# Patient Record
Sex: Male | Born: 1965
Health system: Southern US, Community
[De-identification: ages and names within clinical notes are randomized; demographics above are authoritative.]

## PROBLEM LIST (undated history)

## (undated) DIAGNOSIS — Z8619 Personal history of other infectious and parasitic diseases: Secondary | ICD-10-CM

## (undated) DIAGNOSIS — M353 Polymyalgia rheumatica: Secondary | ICD-10-CM

## (undated) DIAGNOSIS — I10 Essential (primary) hypertension: Secondary | ICD-10-CM

## (undated) HISTORY — DX: Polymyalgia rheumatica: M35.3

## (undated) HISTORY — DX: Personal history of other infectious and parasitic diseases: Z86.19

## (undated) HISTORY — PX: HAND SURGERY: SHX662

## (undated) HISTORY — PX: WISDOM TOOTH EXTRACTION: SHX21

## (undated) HISTORY — DX: Essential (primary) hypertension: I10

## (undated) HISTORY — PX: OTHER SURGICAL HISTORY: SHX169

---

## 2006-10-17 ENCOUNTER — Ambulatory Visit: Payer: Self-pay | Admitting: Internal Medicine

## 2006-10-17 LAB — CONVERTED CEMR LAB
ALT: 32 units/L (ref 0–40)
AST: 26 units/L (ref 0–37)
Albumin: 3.7 g/dL (ref 3.5–5.2)
Alkaline Phosphatase: 53 units/L (ref 39–117)
BUN: 13 mg/dL (ref 6–23)
Basophils Absolute: 0.1 10*3/uL (ref 0.0–0.1)
Basophils Relative: 0.9 % (ref 0.0–1.0)
Bilirubin, Direct: 0.1 mg/dL (ref 0.0–0.3)
CO2: 32 meq/L (ref 19–32)
Calcium: 9.4 mg/dL (ref 8.4–10.5)
Chloride: 108 meq/L (ref 96–112)
Cholesterol: 136 mg/dL (ref 0–200)
Creatinine, Ser: 0.9 mg/dL (ref 0.4–1.5)
Eosinophils Absolute: 0.1 10*3/uL (ref 0.0–0.6)
Eosinophils Relative: 1.6 % (ref 0.0–5.0)
GFR calc Af Amer: 120 mL/min
GFR calc non Af Amer: 99 mL/min
Glucose, Bld: 114 mg/dL — ABNORMAL HIGH (ref 70–99)
HCT: 38.2 % — ABNORMAL LOW (ref 39.0–52.0)
HDL: 40 mg/dL (ref >39.0)
Hemoglobin: 13.1 g/dL (ref 13.0–17.0)
Hgb A1c MFr Bld: 5.7 % (ref 4.6–6.0)
LDL Cholesterol: 74 mg/dL (ref 0–99)
Lymphocytes Relative: 24.1 % (ref 12.0–46.0)
MCHC: 34.2 g/dL (ref 30.0–36.0)
MCV: 88.5 fL (ref 78.0–100.0)
Monocytes Absolute: 1.1 10*3/uL — ABNORMAL HIGH (ref 0.2–0.7)
Monocytes Relative: 17.6 % — ABNORMAL HIGH (ref 3.0–11.0)
Neutro Abs: 3.3 10*3/uL (ref 1.4–7.7)
Neutrophils Relative %: 55.8 % (ref 43.0–77.0)
Platelets: 179 10*3/uL (ref 150–400)
Potassium: 4.1 meq/L (ref 3.5–5.1)
RBC: 4.32 M/uL (ref 4.22–5.81)
RDW: 12.7 % (ref 11.5–14.6)
Sodium: 148 meq/L — ABNORMAL HIGH (ref 135–145)
TSH: 2.57 microintl units/mL (ref 0.35–5.50)
Total Bilirubin: 0.5 mg/dL (ref 0.3–1.2)
Total CHOL/HDL Ratio: 3.4
Total Protein: 6.8 g/dL (ref 6.0–8.3)
Triglycerides: 112 mg/dL (ref 0–149)
VLDL: 22 mg/dL (ref 0–40)
WBC: 6 10*3/uL (ref 4.5–10.5)

## 2006-10-19 ENCOUNTER — Ambulatory Visit: Payer: Self-pay | Admitting: Internal Medicine

## 2006-10-24 ENCOUNTER — Ambulatory Visit: Payer: Self-pay | Admitting: Internal Medicine

## 2007-09-25 ENCOUNTER — Encounter: Payer: Self-pay | Admitting: Internal Medicine

## 2009-02-25 ENCOUNTER — Ambulatory Visit: Payer: Self-pay | Admitting: Internal Medicine

## 2009-02-27 LAB — CONVERTED CEMR LAB
Basophils Absolute: 0 10*3/uL (ref 0.0–0.1)
HCT: 39.8 % (ref 39.0–52.0)
Lymphocytes Relative: 30.6 % (ref 12.0–46.0)
Lymphs Abs: 1.7 10*3/uL (ref 0.7–4.0)
MCV: 90.8 fL (ref 78.0–100.0)
Monocytes Absolute: 0.7 10*3/uL (ref 0.1–1.0)
Platelets: 172 10*3/uL (ref 150.0–400.0)
Prothrombin Time: 10.9 s (ref 10.9–13.3)
RBC: 4.38 M/uL (ref 4.22–5.81)
RDW: 11.9 % (ref 11.5–14.6)

## 2009-10-27 ENCOUNTER — Ambulatory Visit: Payer: Self-pay | Admitting: Internal Medicine

## 2009-10-27 LAB — CONVERTED CEMR LAB
Albumin: 4.1 g/dL (ref 3.5–5.2)
Basophils Absolute: 0 10*3/uL (ref 0.0–0.1)
Chloride: 106 meq/L (ref 96–112)
Creatinine, Ser: 0.9 mg/dL (ref 0.4–1.5)
Eosinophils Absolute: 0.1 10*3/uL (ref 0.0–0.7)
Eosinophils Relative: 2 % (ref 0.0–5.0)
GFR calc non Af Amer: 97.3 mL/min (ref >60)
LDL Cholesterol: 89 mg/dL (ref 0–99)
Leukocytes, UA: NEGATIVE
Lymphocytes Relative: 38.3 % (ref 12.0–46.0)
Lymphs Abs: 2 10*3/uL (ref 0.7–4.0)
MCV: 93.8 fL (ref 78.0–100.0)
Monocytes Relative: 11 % (ref 3.0–12.0)
Neutro Abs: 2.4 10*3/uL (ref 1.4–7.7)
Neutrophils Relative %: 48 % (ref 43.0–77.0)
Nitrite: NEGATIVE
PSA: 0.62 ng/mL (ref 0.10–4.00)
Potassium: 4.2 meq/L (ref 3.5–5.1)
RBC: 4.31 M/uL (ref 4.22–5.81)
Specific Gravity, Urine: >=1.03 (ref 1.000–1.030)
TSH: 2.39 microintl units/mL (ref 0.35–5.50)
Total Bilirubin: 0.5 mg/dL (ref 0.3–1.2)
Total CHOL/HDL Ratio: 3
Triglycerides: 74 mg/dL (ref 0.0–149.0)
Urobilinogen, UA: 0.2 (ref 0.0–1.0)
WBC: 5.1 10*3/uL (ref 4.5–10.5)

## 2009-11-03 ENCOUNTER — Ambulatory Visit: Payer: Self-pay | Admitting: Internal Medicine

## 2009-11-04 LAB — CONVERTED CEMR LAB: CRP, High Sensitivity: 0.1 (ref 0.00–5.00)

## 2010-09-29 NOTE — Assessment & Plan Note (Signed)
Summary: cpx/cjr   Vital Signs:  Patient profile:   45 year old male Height:      71 inches Weight:      180.5 pounds BMI:     25.27 Pulse rate:   68 / minute Pulse rhythm:   regular Resp:     12 per minute BP sitting:   112 / 76  (left arm)  Vitals Entered By: Gladis Riffle, RN (November 03, 2009 8:15 AM) CC: cpx, labs done Is Patient Diabetic? No   CC:  cpx and labs done.  History of Present Illness: CPX  Preventive Screening-Counseling & Management  Alcohol-Tobacco     Smoking Status: never  Current Medications (verified): 1)  None  Allergies (verified): No Known Drug Allergies  Family History: mother--healthy father--CABG at age 32 yo (poor health habits), Throat CA--deceased age 13  Social History: Married Never Smoked Alcohol use-yes--minimal exercise 3 times weekly Occupation: Human resources officer (traveling a lot)   Impression & Recommendations:  Problem # 1:  PREVENTIVE HEALTH CARE (ICD-V70.0) health maint UTD  Other Orders: Venipuncture (16109) TLB-CRP-High Sensitivity (C-Reactive Protein) (86140-FCRP)   Immunization History:  Tetanus/Td Immunization History:    Tetanus/Td:  historical (08/30/2002)  Physical Exam General Appearance: well developed, well nourished, no acute distress Eyes: conjunctiva and lids normal, PERRL, EOMI,  Ears, Nose, Mouth, Throat: TM clear, nares clear, oral exam WNL Neck: supple, no lymphadenopathy, no thyromegaly, no JVD Respiratory: clear to auscultation and percussion, respiratory effort normal Cardiovascular: regular rate and rhythm, S1-S2, no murmur, rub or gallop, no bruits, peripheral pulses normal and symmetric, no cyanosis, clubbing, edema or varicosities Chest: no scars, masses, tenderness; no asymmetry, skin changes, nipple discharge, no gynecomastia   Gastrointestinal: soft, non-tender; no hepatosplenomegaly, masses; active bowel sounds all quadrants, ; no masses, tenderness, hemorrhoids    Genitourinary: no hernia, testicular mass, or prostate enlargement Lymphatic: no cervical, axillary or inguinal adenopathy Musculoskeletal: gait normal, muscle tone and strength WNL, no joint swelling, effusions, discoloration, crepitus  Skin: clear, good turgor, color WNL, no rashes, lesions, or ulcerations Neurologic: normal mental status, normal reflexes, normal strength, sensation, and motion Psychiatric: alert; oriented to person, place and time Other Exam:      Immunization History:  Tetanus/Td Immunization History:    Tetanus/Td:  Historical (08/30/2002)

## 2012-05-30 ENCOUNTER — Other Ambulatory Visit (INDEPENDENT_AMBULATORY_CARE_PROVIDER_SITE_OTHER): Payer: BC Managed Care – PPO

## 2012-05-30 DIAGNOSIS — Z0000 Encounter for general adult medical examination without abnormal findings: Secondary | ICD-10-CM

## 2012-05-30 LAB — HEPATIC FUNCTION PANEL
AST: 20 U/L (ref 0–37)
Albumin: 4.2 g/dL (ref 3.5–5.2)
Alkaline Phosphatase: 43 U/L (ref 39–117)
Bilirubin, Direct: 0 mg/dL (ref 0.0–0.3)
Total Protein: 7.2 g/dL (ref 6.0–8.3)

## 2012-05-30 LAB — CBC WITH DIFFERENTIAL/PLATELET
Eosinophils Relative: 0.9 % (ref 0.0–5.0)
HCT: 42 % (ref 39.0–52.0)
Lymphs Abs: 2 10*3/uL (ref 0.7–4.0)
MCV: 93.2 fl (ref 78.0–100.0)
Monocytes Absolute: 0.5 10*3/uL (ref 0.1–1.0)
Platelets: 201 10*3/uL (ref 150.0–400.0)
WBC: 6.2 10*3/uL (ref 4.5–10.5)

## 2012-05-30 LAB — POCT URINALYSIS DIPSTICK
Ketones, UA: NEGATIVE
Leukocytes, UA: NEGATIVE
Protein, UA: NEGATIVE
Spec Grav, UA: 1.015
pH, UA: 7.5

## 2012-05-30 LAB — BASIC METABOLIC PANEL
CO2: 30 mEq/L (ref 19–32)
Calcium: 9.5 mg/dL (ref 8.4–10.5)
Creatinine, Ser: 1 mg/dL (ref 0.4–1.5)
GFR: 83.25 mL/min (ref >60.00)

## 2012-05-30 LAB — LIPID PANEL
Cholesterol: 205 mg/dL — ABNORMAL HIGH (ref 0–200)
Triglycerides: 128 mg/dL (ref 0.0–149.0)

## 2012-06-23 ENCOUNTER — Ambulatory Visit (INDEPENDENT_AMBULATORY_CARE_PROVIDER_SITE_OTHER): Payer: BC Managed Care – PPO | Admitting: Internal Medicine

## 2012-06-23 ENCOUNTER — Encounter: Payer: Self-pay | Admitting: Internal Medicine

## 2012-06-23 VITALS — BP 124/72 | HR 54 | Temp 98.0°F | Ht 71.25 in | Wt 184.0 lb

## 2012-06-23 DIAGNOSIS — Z0000 Encounter for general adult medical examination without abnormal findings: Secondary | ICD-10-CM

## 2012-06-23 DIAGNOSIS — E785 Hyperlipidemia, unspecified: Secondary | ICD-10-CM

## 2012-06-23 NOTE — Progress Notes (Signed)
Patient ID: Daniel Blair, male   DOB: [birth year], 46 y.o.   MRN: [id number] cpx  No past medical history on file.  History   Social History  . Marital Status: Married    Spouse Name: N/A    Number of Children: N/A  . Years of Education: N/A   Occupational History  . Not on file.   Social History Main Topics  . Smoking status: Never Smoker   . Smokeless tobacco: Not on file  . Alcohol Use: Yes  . Drug Use: No  . Sexually Active: Not on file   Other Topics Concern  . Not on file   Social History Narrative  . No narrative on file    No past surgical history on file.  Family History  Problem Relation Age of Onset  . Heart disease Father     CABG  . Cancer Father     throat    No Known Allergies  No current outpatient prescriptions on file prior to visit.     patient denies chest pain, shortness of breath, orthopnea. Denies lower extremity edema, abdominal pain, change in appetite, change in bowel movements. Patient denies rashes, musculoskeletal complaints. No other specific complaints in a complete review of systems.   BP 124/72  Pulse 54  Temp 98 F (36.7 C) (Oral)  Ht 5' 11.25" (1.81 m)  Wt 184 lb (83.462 kg)  BMI 25.48 kg/m2 Well-developed male in no acute distress. HEENT exam atraumatic, normocephalic, extraocular muscles are intact. Conjunctivae are pink without exudate. Neck is supple without lymphadenopathy, thyromegaly, jugular venous distention. Chest is clear to auscultation without increased work of breathing. Cardiac exam S1-S2 are regular. The PMI is normal. No significant murmurs or gallops. Abdominal exam active bowel sounds, soft, nontender. No abdominal bruits. Extremities no clubbing cyanosis or edema. Peripheral pulses are normal without bruits. Neurologic exam alert and oriented without any motor or sensory deficits.   A/P- well visit, health maint UTD

## 2012-09-18 ENCOUNTER — Other Ambulatory Visit (INDEPENDENT_AMBULATORY_CARE_PROVIDER_SITE_OTHER): Payer: BC Managed Care – PPO

## 2012-09-18 DIAGNOSIS — E785 Hyperlipidemia, unspecified: Secondary | ICD-10-CM

## 2012-09-18 LAB — LIPID PANEL
HDL: 48.9 mg/dL (ref >39.00)
LDL Cholesterol: 87 mg/dL (ref 0–99)
Total CHOL/HDL Ratio: 3

## 2012-09-25 ENCOUNTER — Ambulatory Visit: Payer: BC Managed Care – PPO | Admitting: Internal Medicine

## 2012-09-26 ENCOUNTER — Encounter: Payer: Self-pay | Admitting: Internal Medicine

## 2012-09-26 ENCOUNTER — Ambulatory Visit (INDEPENDENT_AMBULATORY_CARE_PROVIDER_SITE_OTHER): Payer: BC Managed Care – PPO | Admitting: Internal Medicine

## 2012-09-26 VITALS — BP 116/80 | HR 64 | Temp 98.3°F | Ht 71.0 in | Wt 178.0 lb

## 2012-09-26 DIAGNOSIS — E785 Hyperlipidemia, unspecified: Secondary | ICD-10-CM

## 2012-09-26 NOTE — Progress Notes (Signed)
Patient ID: Daniel Blair, male   DOB: 1966-08-07, 47 y.o.   MRN: [id number] F/u lipids- he feels well and has been exercising and following a low fat diet  Reviewed previous lipid panel  Exam-- reviewed vitals.  A/p_ hyperlipidemia- resolved  Lipid Panel     Component Value Date/Time   CHOL 150 09/18/2012 0831   TRIG 71.0 09/18/2012 0831   HDL 48.90 09/18/2012 0831   CHOLHDL 3 09/18/2012 0831   VLDL 14.2 09/18/2012 0831   LDLCALC 87 09/18/2012 0831    Continue excellent health habits

## 2013-02-06 ENCOUNTER — Ambulatory Visit (INDEPENDENT_AMBULATORY_CARE_PROVIDER_SITE_OTHER): Payer: Managed Care, Other (non HMO) | Admitting: Family Medicine

## 2013-02-06 ENCOUNTER — Encounter: Payer: Self-pay | Admitting: Family Medicine

## 2013-02-06 VITALS — BP 120/86 | Temp 98.6°F | Wt 183.0 lb

## 2013-02-06 DIAGNOSIS — H00016 Hordeolum externum left eye, unspecified eyelid: Secondary | ICD-10-CM

## 2013-02-06 DIAGNOSIS — H00019 Hordeolum externum unspecified eye, unspecified eyelid: Secondary | ICD-10-CM

## 2013-02-06 NOTE — Progress Notes (Signed)
Chief Complaint  Patient presents with  . infecton under eye    HPI:  Acute visit for bump below eye: -L lower eyelid -started yesterday -has done anything for -denies: fevers, pain, chills, decreased vision  ROS: See pertinent positives and negatives per HPI.  No past medical history on file.  Family History  Problem Relation Age of Onset  . Heart disease Father     CABG  . Cancer Father     throat    History   Social History  . Marital Status: Married    Spouse Name: N/A    Number of Children: N/A  . Years of Education: N/A   Social History Main Topics  . Smoking status: Never Smoker   . Smokeless tobacco: None  . Alcohol Use: Yes  . Drug Use: No  . Sexually Active: None   Other Topics Concern  . None   Social History Narrative  . None    No current outpatient prescriptions on file.  EXAM:  Filed Vitals:   02/06/13 1547  BP: 120/86  Temp: 98.6 F (37 C)    Body mass index is 25.53 kg/(m^2).  GENERAL: vitals reviewed and listed above, alert, oriented, appears well hydrated and in no acute distress  HEENT: atraumatic, conjunttiva clear - hordeolum L lower eyelid, no obvious abnormalities on inspection of external nose and ears, EOM and visual acuity grossly intact, no signs of cellulitis or pustule  NECK: no obvious masses on inspection  LUNGS: clear to auscultation bilaterally, no wheezes, rales or rhonchi, good air movement  CV: HRRR, no peripheral edema  MS: moves all extremities without noticeable abnormality  PSYCH: pleasant and cooperative, no obvious depression or anxiety  ASSESSMENT AND PLAN:  Discussed the following assessment and plan:  Hordeolum externum, left  -Patient advised to return or notify a doctor immediately if symptoms worsen or persist or new concerns arise.  Patient Instructions  Sty A sty (hordeolum) is a blockage of a gland in the eyelid located at the base of the eyelash. A sty may develop a white or yellow  head of pus. It can be puffy (swollen). Usually, the sty will burst and pus will come out on its own. They do not leave lumps in the eyelid once they drain. A sty is often confused with another form of cyst of the eyelid called a chalazion. Chalazions occur within the eyelid and not on the edge where the bases of the eyelashes are. They often are red, sore and then form firm lumps in the eyelid. CAUSES   Germs.  Lasting (chronic) eyelid inflammation. SYMPTOMS   Tenderness, redness and swelling along the edge of the eyelid at the base of the eyelashes.  Sometimes, there is a white or yellow head of pus. It may or may not drain. DIAGNOSIS  An ophthalmologist will be able to distinguish between a sty and a chalazion and treat the condition appropriately.  TREATMENT   Styes are typically treated with warm packs (compresses) until drainage occurs.  In rare cases, medicines that kill germs (antibiotics) may be prescribed. These antibiotics may be in the form of drops, cream or pills.  If a hard lump has formed, it is generally necessary to do a small incision and remove the hardened contents of the cyst in a minor surgical procedure done in the office.  In suspicious cases, your caregiver may send the contents of the cyst to the lab to be certain that it is not a rare, but  dangerous form of cancer of the glands of the eyelid. HOME CARE INSTRUCTIONS   Wash your hands often and dry them with a clean towel. Avoid touching your eyelid. This may spread the infection to other parts of the eye.  Apply heat to your eyelid for 10 to 20 minutes, several times a day, to ease pain and help to heal it faster.  Do not squeeze the sty. Allow it to drain on its own. Wash your eyelid carefully 3 to 4 times per day to remove any pus. SEEK IMMEDIATE MEDICAL CARE IF:   Your eye becomes painful or puffy (swollen).  Your vision changes.  Your sty does not drain by itself within 3 days.  Your sty comes back  within a short period of time, even with treatment.  You have redness (inflammation) around the eye.  You have a fever. Document Released: 05/26/2005 Document Revised: 11/08/2011 Document Reviewed: 01/28/2009 [facility] Patient Information 2014 [place], Lona Kettle, Dahlia Client R.

## 2013-02-06 NOTE — Patient Instructions (Addendum)
Sty A sty (hordeolum) is a blockage of a gland in the eyelid located at the base of the eyelash. A sty may develop a white or yellow head of pus. It can be puffy (swollen). Usually, the sty will burst and pus will come out on its own. They do not leave lumps in the eyelid once they drain. A sty is often confused with another form of cyst of the eyelid called a chalazion. Chalazions occur within the eyelid and not on the edge where the bases of the eyelashes are. They often are red, sore and then form firm lumps in the eyelid. CAUSES   Germs.  Lasting (chronic) eyelid inflammation. SYMPTOMS   Tenderness, redness and swelling along the edge of the eyelid at the base of the eyelashes.  Sometimes, there is a white or yellow head of pus. It may or may not drain. DIAGNOSIS  An ophthalmologist will be able to distinguish between a sty and a chalazion and treat the condition appropriately.  TREATMENT   Styes are typically treated with warm packs (compresses) until drainage occurs.  In rare cases, medicines that kill germs (antibiotics) may be prescribed. These antibiotics may be in the form of drops, cream or pills.  If a hard lump has formed, it is generally necessary to do a small incision and remove the hardened contents of the cyst in a minor surgical procedure done in the office.  In suspicious cases, your caregiver may send the contents of the cyst to the lab to be certain that it is not a rare, but dangerous form of cancer of the glands of the eyelid. HOME CARE INSTRUCTIONS   Wash your hands often and dry them with a clean towel. Avoid touching your eyelid. This may spread the infection to other parts of the eye.  Apply heat to your eyelid for 10 to 20 minutes, several times a day, to ease pain and help to heal it faster.  Do not squeeze the sty. Allow it to drain on its own. Wash your eyelid carefully 3 to 4 times per day to remove any pus. SEEK IMMEDIATE MEDICAL CARE IF:   Your eye  becomes painful or puffy (swollen).  Your vision changes.  Your sty does not drain by itself within 3 days.  Your sty comes back within a short period of time, even with treatment.  You have redness (inflammation) around the eye.  You have a fever. Document Released: 05/26/2005 Document Revised: 11/08/2011 Document Reviewed: 01/28/2009 [facility] Patient Information 2014 [place], [place].

## 2014-08-02 ENCOUNTER — Ambulatory Visit: Payer: Managed Care, Other (non HMO) | Admitting: Family Medicine

## 2015-01-02 ENCOUNTER — Encounter: Payer: Self-pay | Admitting: Family Medicine

## 2015-01-02 ENCOUNTER — Ambulatory Visit (INDEPENDENT_AMBULATORY_CARE_PROVIDER_SITE_OTHER): Payer: Managed Care, Other (non HMO) | Admitting: Family Medicine

## 2015-01-02 VITALS — BP 106/74 | HR 68 | Temp 97.9°F | Ht 71.0 in | Wt 193.0 lb

## 2015-01-02 DIAGNOSIS — R739 Hyperglycemia, unspecified: Secondary | ICD-10-CM

## 2015-01-02 DIAGNOSIS — Z23 Encounter for immunization: Secondary | ICD-10-CM

## 2015-01-02 DIAGNOSIS — Z0000 Encounter for general adult medical examination without abnormal findings: Secondary | ICD-10-CM | POA: Diagnosis not present

## 2015-01-02 NOTE — Progress Notes (Signed)
Daniel Reddish, MD Phone: [phone]  Subjective:  Patient presents today for their annual physical and to establish care former patient of Dr. Leanne Chang . Chief complaint-noted.   Patient doing very well. Exercising 4 days a week though [employer] which he loves. We discussed with muscle gain target weight 180.   ROS- full  review of systems was completed and negative except for: sneezes hard, frequency per family urinary-no nocturia. Pertinent negative no chest pain or shortness of breath.   The following were reviewed and entered/updated in epic: No past medical history on file. Healthy.   Past Surgical History  Procedure Laterality Date  . Hand surgery      2012-7 screws and a pin    Family History  Problem Relation Age of Onset  . Heart disease Father     CABG age 72, died 61. Smoker, far different lifestyle.   . Cancer Father     throat, smoker    Medications- reviewed and updated No current outpatient prescriptions on file.   No current facility-administered medications for this visit.    Allergies-reviewed and updated No Known Allergies  History   Social History  . Marital Status: Married    Spouse Name: N/A  . Number of Children: N/A  . Years of Education: N/A   Social History Main Topics  . Smoking status: Never Smoker   . Smokeless tobacco: Not on file  . Alcohol Use: 3.6 oz/week    6 Standard drinks or equivalent per week  . Drug Use: No  . Sexual Activity: Not on file   Other Topics Concern  . None   Social History Narrative   Married. 2 kids oldest 3 in 2016.       Self employeed.       Hobbies: exercise, play golf, read    ROS--See HPI   Objective: BP 106/74 mmHg  Pulse 68  Temp(Src) 97.9 F (36.6 C)  Ht 5\' 11"  (1.803 m)  Wt 193 lb (87.544 kg)  BMI 26.93 kg/m2 Gen: NAD, resting comfortably HEENT: Mucous membranes are moist. Oropharynx normal. TM obscured by cerumen Neck: no thyromegaly CV: RRR no murmurs rubs or  gallops Lungs: CTAB no crackles, wheeze, rhonchi Abdomen: soft/nontender/nondistended/normal bowel sounds. No rebound or guarding.  Ext: no edema Skin: warm, dry, no rash Neuro: grossly normal, moves all extremities, PERRLA Declined rectal and GU  Assessment/Plan:  49 y.o. male presenting for annual physical.  Health Maintenance counseling: 1. Anticipatory guidance: Patient counseled regarding regular dental exams, wearing seatbelts, wearing sunscreen. Sees dermatology- history biopsies but no cancer.  2. Risk factor reduction:  Advised patient of need for regular exercise and diet rich and fruits and vegetables to reduce risk of heart attack and stroke.  3. Immunizations/screenings/ancillary studies Health Maintenance Due  Topic Date Due  . TETANUS/TDAP  -today 08/30/2012   1 year follow up to discuss prostate and colon cancer screening  Future fasting labs  Orders Placed This Encounter  Procedures  . Tdap vaccine greater than or equal to 7yo IM  . CBC    [place]    Standing Status: Future     Number of Occurrences:      Standing Expiration Date: 01/02/2016  . Comprehensive metabolic panel    [place]    Standing Status: Future     Number of Occurrences:      Standing Expiration Date: 01/02/2016    Order Specific Question:  Has the patient fasted?    Answer:  No  .  Lipid panel    [place]    Standing Status: Future     Number of Occurrences:      Standing Expiration Date: 01/02/2016    Order Specific Question:  Has the patient fasted?    Answer:  No  . TSH    [place]    Standing Status: Future     Number of Occurrences:      Standing Expiration Date: 01/02/2016  . Hemoglobin A1c    [place]    Standing Status: Future     Number of Occurrences:      Standing Expiration Date: 01/02/2016

## 2015-01-02 NOTE — Patient Instructions (Addendum)
Return for labs. Schedule visit at front. Water only after midnight.   Tdap- tetanus shot before you leave  Considering your muscle gain, I think a weight goal of 180 by working on diet is reasonable. Great job with the exercise.

## 2015-01-03 ENCOUNTER — Other Ambulatory Visit (INDEPENDENT_AMBULATORY_CARE_PROVIDER_SITE_OTHER): Payer: Managed Care, Other (non HMO)

## 2015-01-03 DIAGNOSIS — R739 Hyperglycemia, unspecified: Secondary | ICD-10-CM

## 2015-01-03 DIAGNOSIS — Z0000 Encounter for general adult medical examination without abnormal findings: Secondary | ICD-10-CM

## 2015-01-03 LAB — LIPID PANEL
CHOL/HDL RATIO: 3
Cholesterol: 158 mg/dL (ref 0–200)
HDL: 49.1 mg/dL (ref >39.00)
LDL Cholesterol: 86 mg/dL (ref 0–99)
NONHDL: 108.9
Triglycerides: 117 mg/dL (ref 0.0–149.0)
VLDL: 23.4 mg/dL (ref 0.0–40.0)

## 2015-01-03 LAB — COMPREHENSIVE METABOLIC PANEL
ALT: 14 U/L (ref 0–53)
AST: 16 U/L (ref 0–37)
Albumin: 4 g/dL (ref 3.5–5.2)
Alkaline Phosphatase: 45 U/L (ref 39–117)
BILIRUBIN TOTAL: 0.5 mg/dL (ref 0.2–1.2)
BUN: 22 mg/dL (ref 6–23)
CO2: 29 mEq/L (ref 19–32)
CREATININE: 1.02 mg/dL (ref 0.40–1.50)
Calcium: 9.7 mg/dL (ref 8.4–10.5)
Chloride: 103 mEq/L (ref 96–112)
GFR: 82.34 mL/min (ref >60.00)
Glucose, Bld: 93 mg/dL (ref 70–99)
Potassium: 4.4 mEq/L (ref 3.5–5.1)
Sodium: 139 mEq/L (ref 135–145)
Total Protein: 7 g/dL (ref 6.0–8.3)

## 2015-01-03 LAB — CBC
HEMATOCRIT: 41.3 % (ref 39.0–52.0)
Hemoglobin: 13.8 g/dL (ref 13.0–17.0)
MCHC: 33.4 g/dL (ref 30.0–36.0)
MCV: 89.9 fl (ref 78.0–100.0)
Platelets: 217 10*3/uL (ref 150.0–400.0)
RBC: 4.59 Mil/uL (ref 4.22–5.81)
RDW: 13.1 % (ref 11.5–15.5)
WBC: 6.6 10*3/uL (ref 4.0–10.5)

## 2015-01-03 LAB — HEMOGLOBIN A1C: HEMOGLOBIN A1C: 5.8 % (ref 4.6–6.5)

## 2015-01-03 LAB — TSH: TSH: 2.65 u[IU]/mL (ref 0.35–4.50)

## 2015-02-10 ENCOUNTER — Encounter: Payer: Managed Care, Other (non HMO) | Admitting: Family Medicine

## 2015-04-15 ENCOUNTER — Ambulatory Visit (INDEPENDENT_AMBULATORY_CARE_PROVIDER_SITE_OTHER): Payer: Managed Care, Other (non HMO) | Admitting: Family Medicine

## 2015-04-15 ENCOUNTER — Encounter: Payer: Self-pay | Admitting: Family Medicine

## 2015-04-15 VITALS — BP 100/80 | HR 84 | Temp 98.0°F | Ht 71.0 in | Wt 182.5 lb

## 2015-04-15 DIAGNOSIS — J321 Chronic frontal sinusitis: Secondary | ICD-10-CM | POA: Diagnosis not present

## 2015-04-15 MED ORDER — FLUTICASONE PROPIONATE 50 MCG/ACT NA SUSP: 2.0000 | Freq: Every day | NASAL | Status: DC

## 2015-04-15 NOTE — Progress Notes (Signed)
Pre visit review using our clinic review tool, if applicable. No additional management support is needed unless otherwise documented below in the visit note. 

## 2015-04-15 NOTE — Progress Notes (Signed)
  HPI:  Acute visit for:  Sinusitis: -started 2-3 weeks ago -went to [facility] on a Sunday, [facility] a few weeks ago and did amoxicillin -then saw ENT (Dr. Crossly) and was told had bad sinus infection and was told to do clindamycin for 10 days  -reports is doing much better but still with some frontal pain and pain behind R eye that recurred after finishing the abx, has follow up with ENT next week on Tuesday -no hx of sinus issues, allergies or headaches  ROS: See pertinent positives and negatives per HPI.  No past medical history on file.  Past Surgical History  Procedure Laterality Date  . Hand surgery      20 12-7 screws and a pin    Family History  Problem Relation Age of Onset  . Heart disease Father     CABG age 81, died 32. Smoker, far different lifestyle.   . Cancer Father     throat, smoker    Social History   Social History  . Marital Status: Married    Spouse Name: N/A  . Number of Children: N/A  . Years of Education: N/A   Social History Main Topics  . Smoking status: Never Smoker   . Smokeless tobacco: None  . Alcohol Use: 3.6 oz/week    6 Standard drinks or equivalent per week  . Drug Use: No  . Sexual Activity: Not Asked   Other Topics Concern  . None   Social History Narrative   Married. 2 kids oldest 25 in 2016.       Self employeed.       Hobbies: exercise, play golf, read     Current outpatient prescriptions:  .  fluticasone (FLONASE) 50 MCG/ACT nasal spray, Place 2 sprays into both nostrils daily., Disp: 16 g, Rfl: 0  EXAM:  Filed Vitals:   04/15/15 1407  BP: 100/80  Pulse: 84  Temp: 98 F (36.7 C)    Body mass index is 25.46 kg/(m^2).  GENERAL: vitals reviewed and listed above, alert, oriented, appears well hydrated and in no acute distress  HEENT: atraumatic, conjunttiva clear, no obvious abnormalities on inspection of external nose and ears, normal appearance of ear canals and TMs, clear nasal congestion, narrow nasal passages on  the L, mild post oropharyngeal erythema with PND, no tonsillar edema or exudate, no sinus TTP  NECK: no obvious masses on inspection  LUNGS: clear to auscultation bilaterally, no wheezes, rales or rhonchi, good air movement  CV: HRRR, no peripheral edema  MS: moves all extremities without noticeable abnormality  PSYCH: pleasant and cooperative, no obvious depression or anxiety  ASSESSMENT AND PLAN:  Discussed the following assessment and plan:  Frontal sinusitis, unspecified chronicity - Plan: fluticasone (FLONASE) 50 MCG/ACT nasal spray, CT Maxillofacial WO CM  -CT sinuses to confirm dx, flonase, follow up with ENT as planned -if not sinusitis needs follow up with PCP to consider other dx if pain persists and discussed will possibly need further imaging at that point -Patient advised to return or notify a doctor immediately if symptoms worsen or persist or new concerns arise.  There are no Patient Instructions on file for this visit.   Colin Benton R.

## 2015-04-16 ENCOUNTER — Inpatient Hospital Stay: Admission: RE | Admit: 2015-04-16 | Payer: Managed Care, Other (non HMO) | Source: Ambulatory Visit

## 2015-04-17 ENCOUNTER — Inpatient Hospital Stay: Admission: RE | Admit: 2015-04-17 | Payer: Managed Care, Other (non HMO) | Source: Ambulatory Visit

## 2015-04-17 ENCOUNTER — Telehealth: Payer: Self-pay | Admitting: Family Medicine

## 2015-04-17 DIAGNOSIS — R519 Headache, unspecified: Secondary | ICD-10-CM

## 2015-04-17 DIAGNOSIS — R51 Headache: Secondary | ICD-10-CM

## 2015-04-17 DIAGNOSIS — J321 Chronic frontal sinusitis: Secondary | ICD-10-CM

## 2015-04-17 NOTE — Telephone Encounter (Signed)
Pt states he texted Dr Ernesto Rutherford (he is on vacation in [place]) Pt states Dr Ernesto Rutherford prefers pt get a complete brain MRI.  Pt would like to know if you will be willing to put that order in  (MRI)so dr will have when he returns. Pt does not need the cat scan and did not go.

## 2015-04-18 ENCOUNTER — Ambulatory Visit (HOSPITAL_COMMUNITY)
Admission: RE | Admit: 2015-04-18 | Discharge: 2015-04-18 | Disposition: A | Discharge status: Home / Self Care | Payer: Managed Care, Other (non HMO) | Source: Ambulatory Visit | Attending: Family Medicine | Admitting: Family Medicine

## 2015-04-18 ENCOUNTER — Telehealth: Payer: Self-pay | Admitting: Family Medicine

## 2015-04-18 DIAGNOSIS — R519 Headache, unspecified: Secondary | ICD-10-CM

## 2015-04-18 DIAGNOSIS — M2740 Unspecified cyst of jaw: Secondary | ICD-10-CM | POA: Diagnosis not present

## 2015-04-18 DIAGNOSIS — J321 Chronic frontal sinusitis: Secondary | ICD-10-CM

## 2015-04-18 DIAGNOSIS — J329 Chronic sinusitis, unspecified: Secondary | ICD-10-CM | POA: Diagnosis present

## 2015-04-18 DIAGNOSIS — R51 Headache: Secondary | ICD-10-CM

## 2015-04-18 MED ORDER — GADOBENATE DIMEGLUMINE 529 MG/ML IV SOLN
18.0000 mL | Freq: Once | INTRAVENOUS | Status: AC | PRN
  Administered 2015-04-18: 18 mL via INTRAVENOUS

## 2015-04-18 NOTE — Telephone Encounter (Signed)
I called the patient he states he still wants to go forward with the MRI , and wanted me to cancel his CT threw his insurance  , doesn't want to wait for his ENT to do this . I will cancel his CT and proceed to get the MRI  Approve threw his insurance when the order is placed

## 2015-04-18 NOTE — Telephone Encounter (Signed)
Ok Please let pt know it may be difficult to approve this with his insurance, we can try. Alternatively he can wait until ENT get back to order if that is what they prefer as may have better chance for approval per my referral coodinator. Took 3 days to get CT approved.

## 2015-04-18 NOTE — Telephone Encounter (Signed)
pls hold of on faxing order. Continue w/ our referral

## 2015-04-18 NOTE — Telephone Encounter (Signed)
Pt states that [facility] can do his brain mri w/contrast on [place]. Pt thought it was today, But they states they have no appts until Monday.  Pls fax the order to them at [phone]

## 2015-04-21 ENCOUNTER — Inpatient Hospital Stay: Admission: RE | Admit: 2015-04-21 | Payer: Managed Care, Other (non HMO) | Source: Ambulatory Visit

## 2015-04-22 ENCOUNTER — Encounter: Payer: Self-pay | Admitting: Neurology

## 2015-04-22 ENCOUNTER — Ambulatory Visit (INDEPENDENT_AMBULATORY_CARE_PROVIDER_SITE_OTHER): Payer: Managed Care, Other (non HMO) | Admitting: Neurology

## 2015-04-22 VITALS — BP 154/110 | HR 65 | Ht 71.0 in | Wt 179.5 lb

## 2015-04-22 DIAGNOSIS — M316 Other giant cell arteritis: Secondary | ICD-10-CM | POA: Diagnosis not present

## 2015-04-22 DIAGNOSIS — B0230 Zoster ocular disease, unspecified: Secondary | ICD-10-CM | POA: Diagnosis not present

## 2015-04-22 DIAGNOSIS — G08 Intracranial and intraspinal phlebitis and thrombophlebitis: Secondary | ICD-10-CM

## 2015-04-22 MED ORDER — PREDNISONE 20 MG PO TABS: 60.0000 mg | ORAL_TABLET | Freq: Every day | ORAL | Status: DC

## 2015-04-22 NOTE — Patient Instructions (Signed)
Overall you are doing fairly well but I do want to suggest a few things today:   Remember to drink plenty of fluid, eat healthy meals and do not skip any meals. Try to eat protein with a every meal and eat a healthy snack such as fruit or nuts in between meals. Try to keep a regular sleep-wake schedule and try to exercise daily, particularly in the form of walking, 20-30 minutes a day, if you can.   As far as your medications are concerned, I would like to suggest: Prednisone 60mg  daily  As far as diagnostic testing: [facility] brain, [facility]  I would like to see you back in 2 weeks, sooner if we need to. Please call us with any interim questions, concerns, problems, updates or refill requests.   Please also call us for any test results so we can go over those with you on the phone.  My clinical assistant and will answer any of your questions and relay your messages to me and also relay most of my messages to you.   Our phone number is [phone]. We also have an after hours call service for urgent matters and there is a physician on-call for urgent questions. For any emergencies you know to call 911 or go to the nearest emergency room

## 2015-04-22 NOTE — Progress Notes (Signed)
[facility]    Provider:  Dr Jaynee Eagles Referring Provider: Marin Olp, MD Primary Care Physician:  Garret Reddish, MD  CC:  New onset headache  HPI:  Daniel Blair is a 49 y.o. male here as a referral from Dr. Yong Channel for headache.   Started a month ago today. He woke up with mucus, congestion and his head felt like a water bottle. He had a cold. He was prescribed amoxicillin. He felt better on the amoxicillin. It was the worse infection Dr. Ernesto Rutherford ever saw. Gave him a very strong antibiotic. 5 days later he felt great. This was 2 weeks ago. The prescription ended on a Saturday and he left town on the 14th. By Monday his head hurt again, headache came back. He developed a rash on the right side of his face and Dr. Ernesto Rutherford prescribed Valtrex for possible shingles. Started on Saturday. His head is still throbbing. The headaches are throbbing, not piercing, on the top of the head on the right down the face and forehead, behind the eye. And sometimes during the day it is by the ear and if he swallows it hurts. He wakes up at 3-4am in themorning and his head is killing him. Continuous all day long, without alleve it is a 9/10. No ligth sensitivity. No hearing changes. No stiffness in the neck. Sometimes turning to the left hurts. Slight nausea. No vomiting. Worse laying down. Better sitting up. He has a tender rash in the upper right of the forehead. Never had headaches before. He is rarely sick. No vision changes. The right side of his jaw hurts however no problems with chewing or jaw claudication. He does endorse tenderness in the right temple area on palpation. The whole right side of the head is hurting him. He can't lie on that side of the face. No vision changes.   Reviewed notes, labs and imaging from outside physicians, which showed:  MRI of the brain (personally reviewed images):  Normal appearance of the brain itself. No intraorbital or orbital apex abnormality  seen. Cavernous sinuses appear normal.  Mild mucosal thickening of the paranasal sinuses most notable in the right maxillary and frontal sinuses. Few retention cysts of the maxillary sinus floors.  Review of Systems: Patient complains of symptoms per HPI as well as the following symptoms: eye pain, headache, rash, restless legs. Pertinent negatives per HPI. All others negative.   Social History   Social History  . Marital Status: Married    Spouse Name: Erasmo Downer   . Number of Children: 2  . Years of Education: 12+   Occupational History  . Not on file.   Social History Main Topics  . Smoking status: Never Smoker   . Smokeless tobacco: Not on file  . Alcohol Use: 3.6 oz/week    6 Standard drinks or equivalent per week     Comment: 2 drinks per week  . Drug Use: No  . Sexual Activity: Not on file   Other Topics Concern  . Not on file   Social History Narrative   Married. 2 kids oldest 41 in 2016.    Self employeed.    Caffeine use: 1 cup in the morning , 2 sodas per week   Hobbies: exercise, play golf, read    Family History  Problem Relation Age of Onset  . Heart disease Father     CABG age 101, died 82. Smoker, far different lifestyle.   . Cancer Father     throat,  smoker    History reviewed. No pertinent past medical history.  Past Surgical History  Procedure Laterality Date  . Hand surgery Right     2012-7 screws and a pin    Current Outpatient Prescriptions  Medication Sig Dispense Refill  . doxycycline (VIBRA-TABS) 100 MG tablet Take 100 mg by mouth 2 (two) times daily.  0  . Lactobacillus (LACTINEX PO) Take 1 tablet by mouth daily.    . naproxen sodium (ANAPROX) 220 MG tablet Take 220 mg by mouth 2 (two) times daily with a meal. Takes 6 pills a day (aleve) for pain    . valACYclovir (VALTREX) 500 MG tablet Take 500 mg by mouth 3 (three) times daily.  0  . fluticasone (FLONASE) 50 MCG/ACT nasal spray Place 2 sprays into both nostrils daily. (Patient  not taking: Reported on 04/22/2015) 16 g 0   No current facility-administered medications for this visit.    Allergies as of 04/22/2015  . (No Known Allergies)    Vitals: BP 154/110 mmHg  Pulse 65  Ht _0  (1.803 m)  Wt 179 lb 8 oz (81.421 kg)  BMI 25.05 kg/m2 Last Weight:  Wt Readings from Last 1 Encounters:  04/22/15 179 lb 8 oz (81.421 kg)   Last Height:   Ht Readings from Last 1 Encounters:  04/22/15 _1  (1.803 m)   Physical exam: Exam: Gen: pain on the side of the head, conversant, well groomed                     CV: RRR, no MRG. No Carotid Bruits. No peripheral edema, warm, nontender Eyes: Right conjunctivae injected with lid ptosis, right hypesthesia in the forehead and top of the head, there is a vesicular eruption along the trigrminal V1 dermatome.   Neuro: Detailed Neurologic Exam  Speech:    Speech is normal; fluent and spontaneous with normal comprehension.  Cognition:    The patient is oriented to person, place, and time;     recent and remote memory intact;     language fluent;     normal attention, concentration,     fund of knowledge Cranial Nerves:    The pupils are equal, round, and reactive to light. The fundi are normal and spontaneous venous pulsations are present. Visual fields are full to finger confrontation. Extraocular movements are intact. Trigeminal sensation is intact and the muscles of mastication are normal. The face is symmetric. The palate elevates in the midline. Hearing intact. Voice is normal. Shoulder shrug is normal. The tongue has normal motion without fasciculations.   Coordination:    Normal finger to nose and heel to shin. Normal rapid alternating movements.   Gait:    Heel-toe and tandem gait are normal.   Motor Observation:    No asymmetry, no atrophy, and no involuntary movements noted. Tone:    Normal muscle tone.    Posture:    Posture is normal. normal erect    Strength:    Strength is V/V in the upper and  lower limbs.      Sensation: intact to LT     Reflex Exam:  DTR's:    Deep tendon reflexes in the upper and lower extremities are normal bilaterally.   Toes:    The toes are downgoing bilaterally.   Clonus:    Clonus is absent.       Assessment/Plan:  49 year old with a prodrome of headache in the setting of a significant sinus infection.  Right conjunctivae injected with lid ptosis, right hypesthesia in the forehead and top of the head, there is a vesicular eruption along the trigrminal V1 dermatome. Agree with Dr. Ernesto Rutherford this is highly suspicious for Herpes Zoster Opthalmicus. Dr. Ernesto Rutherford started patient on antivirals. Patient has significant right-sided pain in the temple area and jaw on palpation, less likely is temporal arteritis (very unlikely less than the age of 38) but will order a crp and esr just to be sure. Can start a short course steroids that will help with the discomfort and headache pain and may be therapeutic in the very unlikely event of temporal arteritis. Also less likely etiology of headache is extension of the sinus infection into the CNS that could cause cavernous sinus thrombosis or cerebral venous sinus thrombosis.Bacteria can spread to intracranial sites causing meningitis, abscess, thrombophlebitis causing superior sagittal sinus thrombosis and cavernous sinus thrombosis.  MRI of the brain w/wo ordered by Dr. Ernesto Rutherford was normal making cavernous sinus extension less likely, MRV may be performed to check for cerebral venous thrombosis however I have low suspicion of this. This is likely zoster opthalmicus and Dr. Ernesto Rutherford has astutely made the diagnosis and started treating him. Called Dr. Zenia Resides ophthalmology office and they will graciously see patient first thing in the morning.    Sarina Ill, MD  [facility] [address] [place], [place] [address]  Phone [phone] Fax [phone]

## 2015-04-23 ENCOUNTER — Telehealth: Payer: Self-pay | Admitting: Neurology

## 2015-04-23 LAB — C-REACTIVE PROTEIN: CRP: <0.6 mg/L (ref 0.0–4.9)

## 2015-04-23 LAB — SEDIMENTATION RATE: Sed Rate: 2 mm/hr (ref 0–15)

## 2015-04-23 NOTE — Telephone Encounter (Signed)
Spoke to patient. crp and esr normal. He saw dr Katy Fitch this morning who increased his valtrex for possible zoster opthalmicus. Will taper steroids.

## 2015-04-23 NOTE — Telephone Encounter (Signed)
Called and left message. Normal labs, esr and crp.This does not rule out temporal arteritis but does make it less likely Wanted to see what Dr. Katy Fitch stated this morning about herpes zoster opthalmicus  before changing management.

## 2015-04-24 DIAGNOSIS — B0230 Zoster ocular disease, unspecified: Secondary | ICD-10-CM | POA: Insufficient documentation

## 2015-05-06 ENCOUNTER — Encounter: Payer: Self-pay | Admitting: Neurology

## 2015-05-06 ENCOUNTER — Ambulatory Visit (INDEPENDENT_AMBULATORY_CARE_PROVIDER_SITE_OTHER): Payer: Managed Care, Other (non HMO) | Admitting: Neurology

## 2015-05-06 VITALS — BP 124/86 | HR 64 | Ht 71.0 in | Wt 182.0 lb

## 2015-05-06 DIAGNOSIS — B0230 Zoster ocular disease, unspecified: Secondary | ICD-10-CM

## 2015-05-06 MED ORDER — GABAPENTIN 300 MG PO CAPS: 300.0000 mg | ORAL_CAPSULE | Freq: Three times a day (TID) | ORAL | Status: DC

## 2015-05-06 MED ORDER — VALACYCLOVIR HCL 1 G PO TABS: 1000.0000 mg | ORAL_TABLET | Freq: Three times a day (TID) | ORAL | Status: DC

## 2015-05-06 NOTE — Patient Instructions (Signed)
Overall you are doing fairly well but I do want to suggest a few things today:   Remember to drink plenty of fluid, eat healthy meals and do not skip any meals. Try to eat protein with a every meal and eat a healthy snack such as fruit or nuts in between meals. Try to keep a regular sleep-wake schedule and try to exercise daily, particularly in the form of walking, 20-30 minutes a day, if you can.   As far as your medications are concerned, I would like to suggest: Neurontin 300mg  three times a day, One more week of Valtrex, Prednisone 20mg  daily   I would like to see you back in 6 weeks, sooner if we need to. Please call us with any interim questions, concerns, problems, updates or refill requests.   Please also call us for any test results so we can go over those with you on the phone.  My clinical assistant and will answer any of your questions and relay your messages to me and also relay most of my messages to you.   Our phone number is [phone]. We also have an after hours call service for urgent matters and there is a physician on-call for urgent questions. For any emergencies you know to call 911 or go to the nearest emergency room

## 2015-05-06 NOTE — Progress Notes (Signed)
[facility]    Provider:  Dr Jaynee Eagles Referring Provider: Marin Olp, MD Primary Care Physician:  Garret Reddish, MD  CC:  Herpes Zoster Opthalmicus  HPI:  Daniel Blair is a 49 y.o. male here as a follow up for Herpes zoster Opthalmicus. He felt great after taking the steroids and Valtrex. Since he stopped the pain has returned but not as bad. Was a 10/10 and now is a 2/10. Denies any visual changes or pain on eye movement. He still has a little rash with skin sensitivity and right-sided headache. Discussed sequelae of herpes zoster including post-herpetic neuralgia. Will continue the Valtrex for an additional week and 66m prednisone daily.   Initial Visit 04/22/2015: Daniel Blair a 49y.o. male here as a referral from Dr. HYong Channelfor headache.   Started a month ago today. He woke up with mucus, congestion and his head felt like a water bottle. He had a cold. He was prescribed amoxicillin. He felt better on the amoxicillin. It was the worse infection Dr. CErnesto Rutherfordever saw. Gave him a very strong antibiotic. 5 days later he felt great. This was 2 weeks ago. The prescription ended on a Saturday and he left town on the 14th. By Monday his head hurt again, headache came back. He developed a rash on the right side of his face and Dr. CErnesto Rutherfordprescribed Valtrex for possible shingles. Started on Saturday. His head is still throbbing. The headaches are throbbing, not piercing, on the top of the head on the right down the face and forehead, behind the eye. And sometimes during the day it is by the ear and if he swallows it hurts. He wakes up at 3-4am in themorning and his head is killing him. Continuous all day long, without alleve it is a 9/10. No ligth sensitivity. No hearing changes. No stiffness in the neck. Sometimes turning to the left hurts. Slight nausea. No vomiting. Worse laying down. Better sitting up. He has a tender rash in the upper right of the forehead.  Never had headaches before. He is rarely sick. No vision changes. The right side of his jaw hurts however no problems with chewing or jaw claudication. He does endorse tenderness in the right temple area on palpation. The whole right side of the head is hurting him. He can't lie on that side of the face. No vision changes.   Reviewed notes, labs and imaging from outside physicians, which showed:  MRI of the brain (personally reviewed images):  Normal appearance of the brain itself. No intraorbital or orbital apex abnormality seen. Cavernous sinuses appear normal.  Mild mucosal thickening of the paranasal sinuses most notable in the right maxillary and frontal sinuses. Few retention cysts of the maxillary sinus floors.  Review of Systems: Patient complains of symptoms per HPI as well as the following symptoms: eye pain, headache, rash, restless legs. Pertinent negatives per HPI. All others negative.  Social History   Social History  . Marital Status: Married    Spouse Name: KErasmo Downer  . Number of Children: 2  . Years of Education: 12+   Occupational History  . Not on file.   Social History Main Topics  . Smoking status: Never Smoker   . Smokeless tobacco: Not on file  . Alcohol Use: 3.6 oz/week    6 Standard drinks or equivalent per week     Comment: 2 drinks per week  . Drug Use: No  . Sexual Activity: Not on file  Other Topics Concern  . Not on file   Social History Narrative   Married. 2 kids oldest 19 in 2016.    Self employeed.    Caffeine use: 1 cup in the morning , 2 sodas per week   Hobbies: exercise, play golf, read    Family History  Problem Relation Age of Onset  . Heart disease Father     CABG age 68, died 100. Smoker, far different lifestyle.   . Cancer Father     throat, smoker    No past medical history on file.  Past Surgical History  Procedure Laterality Date  . Hand surgery Right     2012-7 screws and a pin    Current Outpatient  Prescriptions  Medication Sig Dispense Refill  . Lactobacillus (LACTINEX PO) Take 1 tablet by mouth daily.    . naproxen sodium (ANAPROX) 220 MG tablet Take 220 mg by mouth 2 (two) times daily with a meal. Takes 6 pills a day (aleve) for pain    . doxycycline (VIBRA-TABS) 100 MG tablet Take 100 mg by mouth 2 (two) times daily.  0  . fluticasone (FLONASE) 50 MCG/ACT nasal spray Place 2 sprays into both nostrils daily. (Patient not taking: Reported on 04/22/2015) 16 g 0  . predniSONE (DELTASONE) 20 MG tablet Take 3 tablets (60 mg total) by mouth daily with breakfast. (Patient not taking: Reported on 05/06/2015) 90 tablet 6  . valACYclovir (VALTREX) 500 MG tablet Take 500 mg by mouth 3 (three) times daily.  0   No current facility-administered medications for this visit.    Allergies as of 05/06/2015  . (No Known Allergies)    Vitals: BP 124/86 mmHg  Pulse 64  Ht 5' 11"  (1.803 m)  Wt 182 lb (82.555 kg)  BMI 25.40 kg/m2 Last Weight:  Wt Readings from Last 1 Encounters:  05/06/15 182 lb (82.555 kg)   Last Height:   Ht Readings from Last 1 Encounters:  05/06/15 5' 11"  (1.803 m)   Physical exam: Exam: Gen: pain on the side of the head, conversant, well groomed  CV: RRR, no MRG. No Carotid Bruits. No peripheral edema, warm, nontender Eyes: Right conjunctivae injected with lid ptosis, right hypesthesia in the forehead and top of the head, there is a vesicular eruption along the trigrminal V1 dermatome that is improved since he was last seen.  Neuro: Detailed Neurologic Exam  Speech:  Speech is normal; fluent and spontaneous with normal comprehension.  Cognition:  The patient is oriented to person, place, and time;   recent and remote memory intact;   language fluent;   normal attention, concentration,   fund of knowledge Cranial Nerves:  The pupils are equal, round, and reactive to light. The fundi are normal and spontaneous venous pulsations  are present. Visual fields are full to finger confrontation. Extraocular movements are intact. Trigeminal sensation is intact and the muscles of mastication are normal. The face is symmetric. The palate elevates in the midline. Hearing intact. Voice is normal. Shoulder shrug is normal. The tongue has normal motion without fasciculations.   Coordination:  Normal finger to nose and heel to shin. Normal rapid alternating movements.   Gait:  Heel-toe and tandem gait are normal.   Motor Observation:  No asymmetry, no atrophy, and no involuntary movements noted. Tone:  Normal muscle tone.   Posture:  Posture is normal. normal erect   Strength:  Strength is V/V in the upper and lower limbs.    Sensation: intact to  LT   Reflex Exam:  DTR's:  Deep tendon reflexes in the upper and lower extremities are normal bilaterally.  Toes:  The toes are downgoing bilaterally.  Clonus:  Clonus is absent.      Assessment/Plan: 49 year old with a prodrome of headache in the setting of a significant sinus infection. Right conjunctivae injected with lid ptosis, right hypesthesia in the forehead and top of the head, there is a vesicular eruption along the trigrminal V1 dermatome. Agree with Dr. Ernesto Rutherford this is highly suspicious for Herpes Zoster Opthalmicus. Dr. Ernesto Rutherford started patient on antivirals. Patient has significant right-sided pain in the temple area and jaw on palpation, less likely is temporal arteritis (very unlikely less than the age of 59) but will order a crp and esr just to be sure(they were both normal). Can continue with a short course steroids that will help with the discomfort and headache and another week of Valtrex.     Sarina Ill, MD  [facility] [address] [place], [place] [address]  Phone [phone] Fax [phone]  A total of 30 minutes was spent face-to-face with this patient. Over half this  time was spent on counseling patient on the Herpes Zoster Opthalmicus diagnosis and different diagnostic and therapeutic options available.

## 2015-05-07 ENCOUNTER — Other Ambulatory Visit: Payer: Managed Care, Other (non HMO)

## 2017-09-06 ENCOUNTER — Encounter: Payer: Self-pay | Admitting: Sports Medicine

## 2017-09-06 ENCOUNTER — Ambulatory Visit (INDEPENDENT_AMBULATORY_CARE_PROVIDER_SITE_OTHER): Payer: 59 | Admitting: Sports Medicine

## 2017-09-06 VITALS — BP 140/78 | Ht 71.0 in | Wt 188.0 lb

## 2017-09-06 DIAGNOSIS — S76319A Strain of muscle, fascia and tendon of the posterior muscle group at thigh level, unspecified thigh, initial encounter: Secondary | ICD-10-CM

## 2017-09-06 DIAGNOSIS — S76302A Unspecified injury of muscle, fascia and tendon of the posterior muscle group at thigh level, left thigh, initial encounter: Secondary | ICD-10-CM | POA: Diagnosis not present

## 2017-09-06 HISTORY — DX: Strain of muscle, fascia and tendon of the posterior muscle group at thigh level, unspecified thigh, initial encounter: S76.319A

## 2017-09-06 NOTE — Assessment & Plan Note (Signed)
[place] exercise protocol HS compression OK to exercise  If no change 6 wks then we need to do [place] and consider gabapentin

## 2017-09-06 NOTE — Progress Notes (Signed)
   [facility] Kerrin Mo, MD Phone: [phone]  Reason For Visit: New Patient Hamstring   #Patient states that back in August 2018 he was doing left hamstring stretches.  He felt a sudden stabbing pain attachment of the hamstring tendons on the ischial tuberosity.  Patient notes that during this initial event he experienced some bruising though denies any swelling.  He states over the past 6 months this pain has been improving.  States the pain is exacerbated by stretching his hamstring, changing directions with running, sometimes getting in and out of a car.  Patient however is able to run straight without any issues.  Denies any lower back pain.  ROS: loss of sensation or muscle weakness.   Past Medical History Reviewed problem list.  Medications- reviewed and updated No additions to family history Social history- patient is a non- smoker  Objective: BP 140/78   Ht 5\' 11"  (1.803 m)   Wt 188 lb (85.3 kg)   BMI 26.22 kg/m  Gen: NAD, alert, cooperative with exam Extremities: No significant pain with patient off insertion of hamstrings on the ischial tuberosity, range of motion slightly reduced on left side compared to right side with repetitive straight leg test ( H TEST)  the decrease SLR on left side compared to right, slightly decreased strength with resistance to hamstring flexion only at knee flexion of 90 deg. on the left compared to the right otherwise 5 out of 5 strength bilaterally, neurovascularly intact Skin: dry, intact, no rashes or lesions Neuro: Strength and sensation grossly intact   Assessment/Plan: See problem based a/p  1. Left hamstring injury, initial encounter: Likely patient might have partially torn his tendon at insertion of hamstring on ischial  tuberosity around 6 months ago.  Patient is slowly recovering.  It is possible that there is some sciatic nerve irritation which is contributing to patient's current continued dull pain over  the past 6 months, it is also possible that given patient likely had a small partial tear, there is scar tissue in hamstring that needs to be lengthened -Will provide patient with hamstring exercises to help lengthen hamstring and improve overall strength - Continue body helix sleeve with activity  - Follow up in 6 weeks if no improvement   I observed and examined the patient with the resident and agree with assessment and plan.  Note reviewed and modified by me. Ila Mcgill, MD

## 2017-09-20 ENCOUNTER — Encounter: Payer: Self-pay | Admitting: Family Medicine

## 2017-09-30 ENCOUNTER — Encounter: Payer: Self-pay | Admitting: Family Medicine

## 2017-09-30 ENCOUNTER — Ambulatory Visit (INDEPENDENT_AMBULATORY_CARE_PROVIDER_SITE_OTHER): Payer: 59 | Admitting: Family Medicine

## 2017-09-30 VITALS — BP 122/90 | HR 79 | Temp 97.7°F | Ht 71.0 in | Wt 190.6 lb

## 2017-09-30 DIAGNOSIS — Z0001 Encounter for general adult medical examination with abnormal findings: Secondary | ICD-10-CM | POA: Diagnosis not present

## 2017-09-30 DIAGNOSIS — Z1211 Encounter for screening for malignant neoplasm of colon: Secondary | ICD-10-CM | POA: Diagnosis not present

## 2017-09-30 DIAGNOSIS — N529 Male erectile dysfunction, unspecified: Secondary | ICD-10-CM | POA: Diagnosis not present

## 2017-09-30 DIAGNOSIS — E663 Overweight: Secondary | ICD-10-CM | POA: Diagnosis not present

## 2017-09-30 DIAGNOSIS — R067 Sneezing: Secondary | ICD-10-CM

## 2017-09-30 DIAGNOSIS — Z23 Encounter for immunization: Secondary | ICD-10-CM | POA: Diagnosis not present

## 2017-09-30 DIAGNOSIS — R030 Elevated blood-pressure reading, without diagnosis of hypertension: Secondary | ICD-10-CM

## 2017-09-30 DIAGNOSIS — I10 Essential (primary) hypertension: Secondary | ICD-10-CM | POA: Insufficient documentation

## 2017-09-30 HISTORY — DX: Sneezing: R06.7

## 2017-09-30 LAB — LIPID PANEL
CHOLESTEROL: 175 mg/dL (ref 0–200)
HDL: 56.5 mg/dL (ref >39.00)
LDL Cholesterol: 89 mg/dL (ref 0–99)
NONHDL: 118.38
Total CHOL/HDL Ratio: 3
Triglycerides: 147 mg/dL (ref 0.0–149.0)
VLDL: 29.4 mg/dL (ref 0.0–40.0)

## 2017-09-30 LAB — COMPREHENSIVE METABOLIC PANEL
ALT: 16 U/L (ref 0–53)
AST: 14 U/L (ref 0–37)
Albumin: 4.2 g/dL (ref 3.5–5.2)
Alkaline Phosphatase: 54 U/L (ref 39–117)
BILIRUBIN TOTAL: 0.5 mg/dL (ref 0.2–1.2)
BUN: 22 mg/dL (ref 6–23)
CO2: 31 mEq/L (ref 19–32)
Calcium: 9.4 mg/dL (ref 8.4–10.5)
Chloride: 102 mEq/L (ref 96–112)
Creatinine, Ser: 1.02 mg/dL (ref 0.40–1.50)
GFR: 81.44 mL/min (ref >60.00)
GLUCOSE: 99 mg/dL (ref 70–99)
POTASSIUM: 4.3 meq/L (ref 3.5–5.1)
Sodium: 140 mEq/L (ref 135–145)
Total Protein: 6.9 g/dL (ref 6.0–8.3)

## 2017-09-30 LAB — CBC
HEMATOCRIT: 41.6 % (ref 39.0–52.0)
HEMOGLOBIN: 13.9 g/dL (ref 13.0–17.0)
MCHC: 33.5 g/dL (ref 30.0–36.0)
MCV: 90.7 fl (ref 78.0–100.0)
Platelets: 247 10*3/uL (ref 150.0–400.0)
RBC: 4.59 Mil/uL (ref 4.22–5.81)
RDW: 12.9 % (ref 11.5–15.5)
WBC: 8.1 10*3/uL (ref 4.0–10.5)

## 2017-09-30 NOTE — Addendum Note (Signed)
Addended by: Lucianne Lei M on: 09/30/2017 10:44 AM   Modules accepted: Orders

## 2017-09-30 NOTE — Patient Instructions (Addendum)
We will call you within a week or two about your referral for colon cancer screening as well as to the allergist. If you do not hear within 3 weeks, give [place] a call.   Meant to mention- some wax in both ears- ok as long as not causing hearing issues  Let me know if you want to try viagra  Blood pressure hair high- definitely see me back 1 year from now so we can recheck this. Happy to do 6 month recheck as well if yo uwould like.

## 2017-09-30 NOTE — Assessment & Plan Note (Signed)
S: has had issues with sneezing for sometime but bad over last 12 months. Gets severe sneezing episodes where people come to check on him. He also gets runny nose for about 10 minutes with itchy eyes every other week or so. Has dog but has had dog for years. No other treatments tried.  A/P: he would like to see an allergist- advised him to at least trial flonase while waiting on appointment (he declines trial of this prior to referral).

## 2017-09-30 NOTE — Assessment & Plan Note (Addendum)
BP Readings from Last 3 Encounters:  09/30/17 122/90  09/06/17 140/78  06/25/24 366/44  diastolic high today. Systolic high last visit. Weight up 8 lbs. Discussed working on getting weight down and increasing exercise. Would not advise BP medicine at this point. Will update labs based on high reading

## 2017-09-30 NOTE — Assessment & Plan Note (Signed)
Having trouble with firmness. No ejaculation issues yet. We discussed potential viagra- he will let me know if he decides he wants to try this

## 2017-09-30 NOTE — Progress Notes (Signed)
Phone: [phone]  Subjective:  Patient presents today for their annual physical. Chief complaint-noted.   See problem oriented charting- ROS- full  review of systems was completed and negative except for: sneezing (which can be severe), seasonal allergies, pulled hamstring  The following were reviewed and entered/updated in epic: History reviewed. No pertinent past medical history. Patient Active Problem List   Diagnosis Date Noted  . Herpes zoster ophthalmicus 04/24/2015    Priority: Medium  . Hamstring strain, initial encounter 09/06/2017    Priority: Low  . Sneezing 09/30/2017  . Erectile dysfunction 09/30/2017  . Elevated blood pressure reading 09/30/2017   Past Surgical History:  Procedure Laterality Date  . HAND SURGERY Right    2012-7 screws and a pin    Family History  Problem Relation Age of Onset  . Heart disease Father        CABG age 93, died 1. Smoker, far different lifestyle.   . Cancer Father        throat, smoker    Medications- reviewed and updated No current outpatient medications on file.   No current facility-administered medications for this visit.     Allergies-reviewed and updated No Known Allergies  Social History Narrative   Married. 2 kids oldest 77 in 2016.    Self employeed.    Caffeine use: 1 cup in the morning , 2 sodas per week   Hobbies: exercise, play golf, read    Objective: BP 122/90 (BP Location: Left Arm, Patient Position: Sitting, Cuff Size: Large)   Pulse 79   Temp 97.7 F (36.5 C) (Oral)   Ht 5\' 11"  (1.803 m)   Wt 190 lb 9.6 oz (86.5 kg)   SpO2 95%   BMI 26.58 kg/m  Gen: NAD, resting comfortably HEENT: Mucous membranes are moist. Oropharynx normal. Some cerumen in both ears Neck: no thyromegaly CV: RRR no murmurs rubs or gallops Lungs: CTAB no crackles, wheeze, rhonchi Abdomen: soft/nontender/nondistended/normal bowel sounds. No rebound or guarding.  Ext: no edema Skin: warm, dry Neuro: grossly normal,  moves all extremities, PERRLA  Assessment/Plan:  52 y.o. male presenting for annual physical.  Health Maintenance counseling: 1. Anticipatory guidance: Patient counseled regarding regular dental exams -q6 months, eye exams -yearly, wearing seatbelts.  2. Risk factor reduction:  Advised patient of need for regular exercise and diet rich and fruits and vegetables to reduce risk of heart attack and stroke.  Weight up 8 lbs from last CPE 2016. Torn hamstring in august- has put on weight since that time- as has limited his exercise. He wants to reverse this as he heals.  Wt Readings from Last 3 Encounters:  09/30/17 190 lb 9.6 oz (86.5 kg)  09/06/17 188 lb (85.3 kg)  05/06/15 182 lb (82.6 kg)  3. Immunizations/screenings/ancillary studies- flu shot today. Advised shingrix but discussed availability issues and option to get at pharmacy- will try to recall him.  Immunization History  Administered Date(s) Administered  . Influenza Split 06/09/2012  . Td 08/30/2002  . Tdap 01/02/2015  4. Prostate cancer screening- have baseline from about 8 years ago- we will start screening at 60 after discussion today.   Lab Results  Component Value Date   PSA 0.62 10/27/2009   5. Colon cancer screening -  refer for colonoscopy today 6. Skin cancer screening- every few years sees dermatology- no skin cancer history. advised regular sunscreen use. Denies worrisome, changing, or new skin lesions.   Status of chronic or acute concerns   Hamstring strain- following with  Dr. Oneida Alar. Has set him back on exercise and weight  Elevated blood pressure reading BP Readings from Last 3 Encounters:  09/30/17 122/90  09/06/17 140/78  87/86/76 720/94  diastolic high today. Systolic high last visit. Weight up 8 lbs. Discussed working on getting weight down and increasing exercise. Would not advise BP medicine at this point. Will update labs based on high reading  Erectile dysfunction Having trouble with firmness. No  ejaculation issues yet. We discussed potential viagra- he will let me know if he decides he wants to try this  Sneezing S: has had issues with sneezing for sometime but bad over last 12 months. Gets severe sneezing episodes where people come to check on him. He also gets runny nose for about 10 minutes with itchy eyes every other week or so. Has dog but has had dog for years. No other treatments tried.  A/P: he would like to see an allergist- advised him to at least trial flonase while waiting on appointment (he declines trial of this prior to referral).   overweight- Encouraged need for healthy eating, regular exercise, weight loss.   CPE 1 year  Lab/Order associations: Screen for colon cancer - Plan: Ambulatory referral to Gastroenterology  Sneezing - Plan: Ambulatory referral to Allergy   Elevated blood pressure reading - Plan: CBC, Comprehensive metabolic panel, Lipid panel  Overweight (BMI 25.0-29.9) - Plan: CBC, Comprehensive metabolic panel, Lipid panel  Encounter for preventative adult health care exam with abnormal findings - Plan: CBC,  Comprehensive metabolic panel, Lipid panel  Return precautions advised.  Garret Reddish, MD

## 2017-10-05 ENCOUNTER — Ambulatory Visit: Payer: 59 | Admitting: Family Medicine

## 2017-10-05 ENCOUNTER — Encounter: Payer: Self-pay | Admitting: Family Medicine

## 2017-10-05 VITALS — BP 124/84 | HR 98 | Temp 98.6°F | Ht 71.0 in | Wt 190.6 lb

## 2017-10-05 DIAGNOSIS — R05 Cough: Secondary | ICD-10-CM

## 2017-10-05 DIAGNOSIS — R6883 Chills (without fever): Secondary | ICD-10-CM | POA: Diagnosis not present

## 2017-10-05 DIAGNOSIS — R52 Pain, unspecified: Secondary | ICD-10-CM

## 2017-10-05 DIAGNOSIS — R059 Cough, unspecified: Secondary | ICD-10-CM

## 2017-10-05 LAB — POCT INFLUENZA A/B
INFLUENZA A, POC: NEGATIVE
Influenza B, POC: NEGATIVE

## 2017-10-05 MED ORDER — AZITHROMYCIN 250 MG PO TABS: ORAL_TABLET | ORAL | 0 refills | Status: DC

## 2017-10-05 MED ORDER — PREDNISONE 50 MG PO TABS: ORAL_TABLET | ORAL | 0 refills | Status: DC

## 2017-10-05 NOTE — Patient Instructions (Signed)
Start the prednisone.  Start the zpack if your symptoms worsen or do not improve in a few days.  Please stay well hydrated.  You can take tylenol and/or motrin as needed for low grade fever and pain.  Please let me know if your symptoms worsen or fail to improve.  Take care, Dr Jerline Pain

## 2017-10-05 NOTE — Progress Notes (Signed)
    Subjective:  Daniel Blair is a 53 y.o. male who presents today for same-day appointment with a chief complaint of cough.   HPI:  Cough, acute issue Started yesterday. Worsened over that time. Associated with headache, myalgias and chills. Took ibuprofen which helped some. No obvious sick contacts. No obvious alleviating or aggravating factors.  ROS: Per HPI  PMH: He reports that  has never smoked. he has never used smokeless tobacco. He reports that he drinks about 3.6 oz of alcohol per week. He reports that he does not use drugs.  Objective:  Physical Exam: BP 124/84 (BP Location: Left Arm, Patient Position: Sitting, Cuff Size: Normal)   Pulse 98   Temp 98.6 F (37 C) (Oral)   Ht 5\' 11"  (1.803 m)   Wt 190 lb 9.6 oz (86.5 kg)   SpO2 98%   BMI 26.58 kg/m   Gen: NAD, resting comfortably HEENT: Right EAC with cerumen. Left TM clear. OP clear. Nasal mucosa slightly erythematous without exudate. No LAD. Maxillary sinuses clear bilaterally. CV: RRR with no murmurs appreciated Pulm: NWOB, CTAB with no crackles, wheezes, or rhonchi  Results for orders placed or performed in visit on 10/05/17 (from the past 24 hour(s))  POCT Influenza A/B     Status: None   Collection Time: 10/05/17 11:14 AM  Result Value Ref Range   Influenza A, POC Negative Negative   Influenza B, POC Negative Negative   Assessment/Plan:  Cough Likely secondary to viral URI. No signs of bacterial infection though symptoms have only been present for about 24 hours. His flu test is negative. Will give 2 day burst of prednisone. Sent in a "pocket prescription" for azithromycin with strict instruction to not start unless symptoms worse or fail to improve within the next several days. Recommended tylenol and/or motrin as needed for low grade fever and pain. Encouraged good oral hydration. Return precautions reviewed. Follow up as needed.    Algis Greenhouse. Jerline Pain, MD 10/05/2017 11:32 AM

## 2017-10-25 DIAGNOSIS — M79662 Pain in left lower leg: Secondary | ICD-10-CM | POA: Diagnosis not present

## 2017-10-25 DIAGNOSIS — M9903 Segmental and somatic dysfunction of lumbar region: Secondary | ICD-10-CM | POA: Diagnosis not present

## 2017-10-25 DIAGNOSIS — M79661 Pain in right lower leg: Secondary | ICD-10-CM | POA: Diagnosis not present

## 2017-10-27 DIAGNOSIS — M79661 Pain in right lower leg: Secondary | ICD-10-CM | POA: Diagnosis not present

## 2017-10-27 DIAGNOSIS — M79662 Pain in left lower leg: Secondary | ICD-10-CM | POA: Diagnosis not present

## 2017-10-27 DIAGNOSIS — M9903 Segmental and somatic dysfunction of lumbar region: Secondary | ICD-10-CM | POA: Diagnosis not present

## 2017-10-31 DIAGNOSIS — M79662 Pain in left lower leg: Secondary | ICD-10-CM | POA: Diagnosis not present

## 2017-10-31 DIAGNOSIS — M79661 Pain in right lower leg: Secondary | ICD-10-CM | POA: Diagnosis not present

## 2017-10-31 DIAGNOSIS — M9903 Segmental and somatic dysfunction of lumbar region: Secondary | ICD-10-CM | POA: Diagnosis not present

## 2017-11-01 ENCOUNTER — Encounter: Payer: Self-pay | Admitting: Allergy and Immunology

## 2017-11-03 ENCOUNTER — Encounter: Payer: Self-pay | Admitting: Family Medicine

## 2017-11-03 ENCOUNTER — Ambulatory Visit: Payer: 59 | Admitting: Family Medicine

## 2017-11-03 DIAGNOSIS — M9903 Segmental and somatic dysfunction of lumbar region: Secondary | ICD-10-CM | POA: Diagnosis not present

## 2017-11-03 DIAGNOSIS — M79661 Pain in right lower leg: Secondary | ICD-10-CM | POA: Diagnosis not present

## 2017-11-03 DIAGNOSIS — M79662 Pain in left lower leg: Secondary | ICD-10-CM | POA: Diagnosis not present

## 2017-11-03 MED ORDER — SILDENAFIL CITRATE 100 MG PO TABS: 100.0000 mg | ORAL_TABLET | Freq: Every day | ORAL | 5 refills | Status: DC | PRN

## 2017-11-07 DIAGNOSIS — M79661 Pain in right lower leg: Secondary | ICD-10-CM | POA: Diagnosis not present

## 2017-11-07 DIAGNOSIS — M79662 Pain in left lower leg: Secondary | ICD-10-CM | POA: Diagnosis not present

## 2017-11-07 DIAGNOSIS — M9903 Segmental and somatic dysfunction of lumbar region: Secondary | ICD-10-CM | POA: Diagnosis not present

## 2017-11-14 DIAGNOSIS — M9903 Segmental and somatic dysfunction of lumbar region: Secondary | ICD-10-CM | POA: Diagnosis not present

## 2017-11-14 DIAGNOSIS — M79661 Pain in right lower leg: Secondary | ICD-10-CM | POA: Diagnosis not present

## 2017-11-14 DIAGNOSIS — M79662 Pain in left lower leg: Secondary | ICD-10-CM | POA: Diagnosis not present

## 2017-11-16 DIAGNOSIS — M9903 Segmental and somatic dysfunction of lumbar region: Secondary | ICD-10-CM | POA: Diagnosis not present

## 2017-11-16 DIAGNOSIS — M79661 Pain in right lower leg: Secondary | ICD-10-CM | POA: Diagnosis not present

## 2017-11-16 DIAGNOSIS — M79662 Pain in left lower leg: Secondary | ICD-10-CM | POA: Diagnosis not present

## 2017-11-21 DIAGNOSIS — M9903 Segmental and somatic dysfunction of lumbar region: Secondary | ICD-10-CM | POA: Diagnosis not present

## 2017-11-21 DIAGNOSIS — M79662 Pain in left lower leg: Secondary | ICD-10-CM | POA: Diagnosis not present

## 2017-11-21 DIAGNOSIS — M79661 Pain in right lower leg: Secondary | ICD-10-CM | POA: Diagnosis not present

## 2017-11-28 DIAGNOSIS — M79662 Pain in left lower leg: Secondary | ICD-10-CM | POA: Diagnosis not present

## 2017-11-28 DIAGNOSIS — M9903 Segmental and somatic dysfunction of lumbar region: Secondary | ICD-10-CM | POA: Diagnosis not present

## 2017-11-28 DIAGNOSIS — M79661 Pain in right lower leg: Secondary | ICD-10-CM | POA: Diagnosis not present

## 2017-12-02 ENCOUNTER — Encounter: Payer: Self-pay | Admitting: Family Medicine

## 2017-12-05 DIAGNOSIS — M79661 Pain in right lower leg: Secondary | ICD-10-CM | POA: Diagnosis not present

## 2017-12-05 DIAGNOSIS — M9903 Segmental and somatic dysfunction of lumbar region: Secondary | ICD-10-CM | POA: Diagnosis not present

## 2017-12-05 DIAGNOSIS — M79662 Pain in left lower leg: Secondary | ICD-10-CM | POA: Diagnosis not present

## 2017-12-12 DIAGNOSIS — M9903 Segmental and somatic dysfunction of lumbar region: Secondary | ICD-10-CM | POA: Diagnosis not present

## 2017-12-12 DIAGNOSIS — M79662 Pain in left lower leg: Secondary | ICD-10-CM | POA: Diagnosis not present

## 2017-12-12 DIAGNOSIS — M79661 Pain in right lower leg: Secondary | ICD-10-CM | POA: Diagnosis not present

## 2017-12-15 ENCOUNTER — Institutional Professional Consult (permissible substitution): Payer: Managed Care, Other (non HMO) | Admitting: Neurology

## 2017-12-23 ENCOUNTER — Encounter: Payer: Self-pay | Admitting: Family Medicine

## 2017-12-23 ENCOUNTER — Ambulatory Visit: Payer: 59 | Admitting: Family Medicine

## 2017-12-23 VITALS — BP 130/80 | HR 64 | Temp 97.4°F | Resp 14 | Ht 71.0 in | Wt 189.0 lb

## 2017-12-23 DIAGNOSIS — M2550 Pain in unspecified joint: Secondary | ICD-10-CM

## 2017-12-23 LAB — VITAMIN B12: VITAMIN B 12: 294 pg/mL (ref 211–911)

## 2017-12-23 LAB — C-REACTIVE PROTEIN: CRP: 0.5 mg/dL (ref 0.5–20.0)

## 2017-12-23 LAB — TSH: TSH: 2.92 u[IU]/mL (ref 0.35–4.50)

## 2017-12-23 LAB — SEDIMENTATION RATE: SED RATE: 13 mm/h (ref 0–20)

## 2017-12-23 NOTE — Patient Instructions (Signed)
We will check blood work today to rule out autoimmune causes for your joint pain.  We will contact you with the results when they are available.  You can continue ibuprofen for now.  Please let me or Dr Yong Channel know if your symptoms worsen.  Take care, Dr Jerline Pain

## 2017-12-23 NOTE — Progress Notes (Signed)
    Subjective:  Daniel Blair is a 52 y.o. male who presents today for same-day appointment with a chief complaint of arthralgias.   HPI:  Arthralgias, new problem Symptoms started about 8 months ago.  Patient tore his right hamstring and since then has had significant difficulty with muscle aches and sores.  Symptoms seem to be worse in the morning.  Symptoms have worsened over the last few months and have become more persistent.  He has aches and pains in his hips, knees, neck, shoulder, and elbows.  He has seen a chiropractor which is helped a little bit.  Pain is intermittent nature.  No fevers or chills.  No swelling or redness.  Takes ibuprofen 200 mg twice daily which helps.  No other obvious alleviating or aggravating factors.  ROS: Per HPI  PMH: He reports that he has never smoked. He has never used smokeless tobacco. He reports that he drinks about 3.6 oz of alcohol per week. He reports that he does not use drugs.  Objective:  Physical Exam: BP 130/80 (BP Location: Left Arm)   Pulse 64   Temp (!) 97.4 F (36.3 C)   Resp 14   Ht 5\' 11"  (1.803 m)   Wt 189 lb (85.7 kg)   SpO2 99%   BMI 26.36 kg/m   Gen: NAD, resting comfortably CV: RRR with no murmurs appreciated Pulm: NWOB, CTAB with no crackles, wheezes, or rhonchi MSK: -Neck: No deformities.  Full range of motion in all directions.  Spurling negative bilaterally. -Shoulders: No deformities.  Full range of motion in all directions.  Rotator cuff strength testing intact bilaterally. -Elbows: No deformities.  Mild tenderness to palpation with resisted supination. -Back: No deformities.  Nontender to palpation.  Full range of motion throughout. -Hips: No deformities.  Mildly limited external rotation.  Normal internal rotation.  Strength 5 out of 5 in all directions. -Knees: No deformities.  Nontender to palpation.  Stable to varus stress.  Strength 5 out of 5 in all directions.  Assessment/Plan:   Polyarthralgia Unclear underlying etiology.  Patient is concerned that he may have underlying systemic disease-this is possible however less likely than lack of inflammatory arthritis and other systemic symptoms.  We will rule this out by checking CRP, CRP, ANA, rheumatoid factor.  Also check TSH and B12 levels.  Patient had CBC and CMET done 2 months ago which was normal-do not need to repeat today.  We will continue using ibuprofen 200 mg twice daily for the time being.  Discussed reasons to return to care.  Given multifocal area of pain, elected to not obtain plain films today.  If above work-up negative, would consider plain films to rule out underlying osteoarthritis.  Algis Greenhouse. Jerline Pain, MD 12/23/2017 5:49 PM

## 2017-12-24 LAB — RHEUMATOID FACTOR: Rhuematoid fact SerPl-aCnc: <14 IU/mL (ref <14)

## 2017-12-26 LAB — ANA: Anti Nuclear Antibody(ANA): NEGATIVE

## 2017-12-27 ENCOUNTER — Encounter: Payer: Self-pay | Admitting: Family Medicine

## 2018-01-13 ENCOUNTER — Ambulatory Visit: Payer: 59 | Admitting: Family Medicine

## 2018-01-20 ENCOUNTER — Telehealth: Payer: Self-pay | Admitting: Family Medicine

## 2018-01-20 ENCOUNTER — Ambulatory Visit: Payer: 59 | Admitting: Family Medicine

## 2018-01-20 DIAGNOSIS — Z0289 Encounter for other administrative examinations: Secondary | ICD-10-CM

## 2018-01-20 NOTE — Patient Instructions (Signed)
Health Maintenance Due  Topic Date Due  . COLONOSCOPY  07/10/2015

## 2018-01-20 NOTE — Telephone Encounter (Signed)
Patient came in for his 11:15am appointment at 11:50am. I explained the no show/late policy and that we have a 27min window. I told him that Dr. Ansel Bong next available slot is June 7 which we scheduled him for.   After looking into his chart/letters I saw that he has not been a no show for our office before and or had not received a letter for a no show therefore, we will waive this one appointment out of courtesy once he receives his statement in the mail and gives [place] a call.    Patient now understand the no show/late policy going forward.

## 2018-01-21 NOTE — Progress Notes (Signed)
Patient arrived late for appointment- and was rescheduled

## 2018-01-30 ENCOUNTER — Encounter

## 2018-01-30 ENCOUNTER — Institutional Professional Consult (permissible substitution): Payer: Managed Care, Other (non HMO) | Admitting: Neurology

## 2018-02-01 ENCOUNTER — Telehealth: Payer: Self-pay | Admitting: Neurology

## 2018-02-01 ENCOUNTER — Institutional Professional Consult (permissible substitution): Payer: Managed Care, Other (non HMO) | Admitting: Neurology

## 2018-02-01 NOTE — Telephone Encounter (Signed)
Patient arrived 35 min for apt today. Very apologetic. Got  the resch time from Monday confused. Requesting to be called if anyone cancels to get him in sooner than Aug. Best call back [phone]

## 2018-02-01 NOTE — Telephone Encounter (Signed)
Note pt has been scheduled.

## 2018-02-02 ENCOUNTER — Encounter: Payer: Self-pay | Admitting: Neurology

## 2018-02-02 NOTE — Telephone Encounter (Signed)
Pt was called on yesterday afternoon and offered an appointment for 6-7 or 6-14 to see Dr Jaynee Eagles. Pt chose the time on 06-07 to check in at 8:00am for the 8:30 appointment. Pt was grateful for the appointemnt

## 2018-02-03 ENCOUNTER — Ambulatory Visit
Admission: RE | Admit: 2018-02-03 | Discharge: 2018-02-03 | Disposition: A | Discharge status: Home / Self Care | Payer: 59 | Source: Ambulatory Visit | Attending: Neurology | Admitting: Neurology

## 2018-02-03 ENCOUNTER — Ambulatory Visit: Payer: 59 | Admitting: Family Medicine

## 2018-02-03 ENCOUNTER — Encounter: Payer: Self-pay | Admitting: Family Medicine

## 2018-02-03 ENCOUNTER — Encounter: Payer: Self-pay | Admitting: Neurology

## 2018-02-03 ENCOUNTER — Ambulatory Visit: Payer: 59 | Admitting: Neurology

## 2018-02-03 VITALS — BP 124/90 | HR 63 | Ht 71.0 in | Wt 189.0 lb

## 2018-02-03 VITALS — BP 124/86 | HR 65 | Temp 97.8°F | Ht 71.0 in | Wt 188.6 lb

## 2018-02-03 DIAGNOSIS — R29898 Other symptoms and signs involving the musculoskeletal system: Secondary | ICD-10-CM | POA: Diagnosis not present

## 2018-02-03 DIAGNOSIS — M450 Ankylosing spondylitis of multiple sites in spine: Secondary | ICD-10-CM | POA: Diagnosis not present

## 2018-02-03 DIAGNOSIS — M2550 Pain in unspecified joint: Secondary | ICD-10-CM | POA: Diagnosis not present

## 2018-02-03 DIAGNOSIS — M25559 Pain in unspecified hip: Secondary | ICD-10-CM

## 2018-02-03 DIAGNOSIS — M791 Myalgia, unspecified site: Secondary | ICD-10-CM

## 2018-02-03 DIAGNOSIS — R030 Elevated blood-pressure reading, without diagnosis of hypertension: Secondary | ICD-10-CM | POA: Diagnosis not present

## 2018-02-03 DIAGNOSIS — E611 Iron deficiency: Secondary | ICD-10-CM

## 2018-02-03 DIAGNOSIS — M2569 Stiffness of other specified joint, not elsewhere classified: Secondary | ICD-10-CM

## 2018-02-03 DIAGNOSIS — M545 Low back pain: Secondary | ICD-10-CM | POA: Diagnosis not present

## 2018-02-03 DIAGNOSIS — M2560 Stiffness of unspecified joint, not elsewhere classified: Secondary | ICD-10-CM

## 2018-02-03 MED ORDER — PREDNISONE 20 MG PO TABS: ORAL_TABLET | ORAL | 3 refills | Status: DC

## 2018-02-03 NOTE — Progress Notes (Signed)
Subjective:  Daniel Blair is a 52 y.o. year old very pleasant male patient who presents for/with See problem oriented charting ROS-denies chest pain or shortness of breath.  Denies headache or blurry vision.  Reports joint pain and stiffness  Past Medical History-  Patient Active Problem List   Diagnosis Date Noted  . Herpes zoster ophthalmicus 04/24/2015    Priority: Medium  . Hamstring strain, initial encounter 09/06/2017    Priority: Low  . Arthralgia 02/03/2018  . Sneezing 09/30/2017  . Erectile dysfunction 09/30/2017  . Elevated blood pressure reading 09/30/2017    Medications- reviewed and updated Current Outpatient Medications  Medication Sig Dispense Refill  . Ascorbic Acid (VITAMIN C) 100 MG tablet Take 100 mg by mouth daily.    Marland Kitchen aspirin EC 81 MG tablet Take 81 mg by mouth daily.    . fexofenadine (ALLEGRA) 60 MG tablet Take 60 mg by mouth 2 (two) times daily.    Marland Kitchen ibuprofen (ADVIL,MOTRIN) 200 MG tablet Take 200 mg by mouth 2 (two) times daily.    . Multiple Vitamin (MULTIVITAMIN WITH MINERALS) TABS tablet Take 1 tablet by mouth daily.    . predniSONE (DELTASONE) 20 MG tablet Take 58m daily with food for 10 days. Then slowly decrease by one pill every 2 days. 90 tablet 3  . sildenafil (VIAGRA) 100 MG tablet Take 1 tablet (100 mg total) by mouth daily as needed for erectile dysfunction. (Patient not taking: Reported on 02/03/2018) 10 tablet 5   No current facility-administered medications for this visit.     Objective: BP 124/86 (BP Location: Left Arm, Patient Position: Sitting, Cuff Size: Normal)   Pulse 65   Temp 97.8 F (36.6 C) (Oral)   Ht 5' 11"  (1.803 m)   Wt 188 lb 9.6 oz (85.5 kg)   SpO2 96%   BMI 26.30 kg/m  Gen: NAD, resting comfortably CV: RRR no murmurs rubs or gallops Lungs: CTAB no crackles, wheeze, rhonchi Abdomen: soft/nontender/nondistended Ext: no edema Skin: warm, dry No obvious weakness  Assessment/Plan:  Elevated blood pressure  reading S: Blood pressure controlled on no medication.  Down 2 pounds from last visit-has been able to be more active as recovering from hamstring strain BP Readings from Last 3 Encounters:  02/03/18 124/86  02/03/18 124/90  12/23/17 130/80  A/P: We discussed blood pressure goal of <140/90. Continue without medication- seems to have improved with weight loss. Diastolic was 90 on our last check- other BP today was from neurology but not clear if there was a recheck  Arthralgia S:  A lot of shoulder, bicep, elbow pain. Even holding pot of coffee stressful. Immense shoulder pain wakes up every morning 3-4 AM in pain. Feels stiff when wakes up. Hard to put on socks- stiffness in his back. Feels like has to lift his left leg in the car. Thought it was all related to tearing hamstring last august. 6-8 months of symptoms and slowly progressive- seem to migrate.  He didn't really mention these symptoms at February visit.   Patient saw Dr. PJerline Painon 12/23/17 and had normal rheumatoid factor, b12, sed rate, CRP, TSH. His B12 was low normal- neurology suggested further workup with MMA.   Reports Chiropractor helped some. Slightly less stiff as day goes on. Ibuprofen helps- 400 in AM and 200 at night and seems to work when he takes it.   Family history-Brother with similar symptoms- 8 years younger than him.   No flu like symptoms. No tick bites that he  knows- so no obvious lyme or rmsf exposure. Still able to walk an hour a day and enjoys it- feels like forward motion is the easiest for him right now.   Saw neurologist Dr. Jaynee Eagles who did extensive lab testing. Appears also planning on x-ray of pelvis which I dont see ordered yet as well as EMG/NCS with consideration of MR brain/neck. Likely will refer to rheumatology after that per her notes. A/P: I told patient that he was under excellent care with neurology- the work-up they have started is extensive and admittedly more broad than I would have started  with-I also think the EMG and potential MRI makes sense.  I encouraged him to complete a work-up they have started-next step would likely be rheumatology.  This could be seronegative rheumatological disease and we will need their opinion.  He is also been placed on prednisone taper which I think is very reasonable.  I wondered about polymyalgia rheumatica but had completely normal ESR and CRP  Other possibilities would be fibromyalgia-like syndrome or viral mediated issues.  Since neurology has started such an excellent work-up-we agreed to follow-up with me for this issue would be as needed primarily  Future Appointments  Date Time Provider [place]  03/16/2018  8:15 AM Marcelino Scot, [facility]Jacques [facility] None  03/16/2018  9:00 AM Melvenia Beam, MD [facility] None  10/03/2018  8:15 AM Marin Olp, MD [facility]   Return precautions advised.  Garret Reddish, MD

## 2018-02-03 NOTE — Patient Instructions (Addendum)
Health Maintenance Due  Topic Date Due  . COLONOSCOPY - Patient will schedule appointment 07/10/2015   Dr. Jaynee Eagles has done a great job on your workup and initial treatment. I think its reasonable to pursue these steps first and then consider rheumatology

## 2018-02-03 NOTE — Patient Instructions (Signed)
Extensive lab testing XR of pevis EMG/NCS  Consider MRI of the brain and neck Suggest after this work consider going to [employer] is a test to check how well your muscles and nerves are working. This procedure includes the combined use of electromyogram (EMG) and nerve conduction study (NCS). EMG is used to look for muscular disorders. NCS, which is also called electroneurogram, measures how well your nerves are controlling your muscles. The procedures are usually performed together to check if your muscles and nerves are healthy. If the reaction to testing is abnormal, this can indicate disease or injury, such as peripheral nerve damage. Tell a health care provider about:  Any allergies you have.  All medicines you are taking, including vitamins, herbs, eye drops, creams, and over-the-counter medicines.  Any problems you or family members have had with anesthetic medicines.  Any blood disorders you have.  Any surgeries you have had.  Any medical conditions you have.  Any pacemaker you have. What are the risks? Generally, this is a safe procedure. However, problems may occur, including:  Infection where the electrodes were inserted.  Bleeding.  What happens before the procedure?  Ask your health care provider about: ? Changing or stopping your regular medicines. This is especially important if you are taking diabetes medicines or blood thinners. ? Taking medicines such as aspirin and ibuprofen. These medicines can thin your blood. Do not take these medicines before your procedure if your health care provider instructs you not to.  Your health care provider may ask you to avoid: ? Caffeine, such as coffee and tea. ? Nicotine. This includes cigarettes and anything with tobacco.  Do not use lotions or creams on the same day that you will be having the procedure. What happens during the procedure? For EMG:  Your health care  provider will ask you to stay in a position so that he or she can access the muscle that will be studied. You may be standing, sitting down, or lying down.  You may be given a medicine that numbs the area (local anesthetic).  A very thin needle that has an electrode on it will be inserted into your muscle.  Another small electrode will be placed on your skin near the muscle.  Your health care provider will ask you to continue to remain still.  The electrodes will send a signal that tells about the electrical activity of your muscles. You may see this on a monitor or hear it in the room.  After your muscles have been studied at rest, your health care provider will ask you to contract or flex your muscles. The electrodes will send a signal that tells about the electrical activity of your muscles.  Your health care provider will remove the electrodes and the electrode needles when the procedure is finished. The procedure may vary among health care providers and hospitals. For NCS:  An electrode that records your nerve activity (recording electrode) will be placed on your skin by the muscle that is being studied.  An electrode that is used as a reference (reference electrode) will be placed near the recording electrode.  A paste or gel will be applied to your skin between the recording electrode and the reference electrode.  Your nerve will be stimulated with a mild shock. Your health care provider will measure how much time it takes for your muscle to react.  Your health care provider will remove the electrodes and the gel when the procedure is  finished. The procedure may vary among health care providers and hospitals. What happens after the procedure?  It is your responsibility to obtain your test results. Ask your health care provider or the department performing the test when and how you will get your results.  Your health care provider may: ? Give you medicines for any  pain. ? Monitor the insertion sites to make sure that they stop bleeding. This information is not intended to replace advice given to you by your health care provider. Make sure you discuss any questions you have with your health care provider. Document Released: 12/17/2004 Document Revised: 01/22/2016 Document Reviewed: 10/07/2014 Elsevier Interactive Patient Education  2018 [employer].

## 2018-02-03 NOTE — Assessment & Plan Note (Addendum)
S:  A lot of shoulder, bicep, elbow pain. Even holding pot of coffee stressful. Immense shoulder pain wakes up every morning 3-4 AM in pain. Feels stiff when wakes up. Hard to put on socks- stiffness in his back. Feels like has to lift his left leg in the car. Thought it was all related to tearing hamstring last august. 6-8 months of symptoms and slowly progressive- seem to migrate.  He didn't really mention these symptoms at February visit.   Patient saw Dr. Jerline Pain on 12/23/17 and had normal rheumatoid factor, b12, sed rate, CRP, TSH. His B12 was low normal- neurology suggested further workup with MMA.   Reports Chiropractor helped some. Slightly less stiff as day goes on. Ibuprofen helps- 400 in AM and 200 at night and seems to work when he takes it.   Family history-Brother with similar symptoms- 8 years younger than him.   No flu like symptoms. No tick bites that he knows- so no obvious lyme or rmsf exposure. Still able to walk an hour a day and enjoys it- feels like forward motion is the easiest for him right now.   Saw neurologist Dr. Jaynee Eagles who did extensive lab testing. Appears also planning on x-ray of pelvis which I dont see ordered yet as well as EMG/NCS with consideration of MR brain/neck. Likely will refer to rheumatology after that per her notes. A/P: I told patient that he was under excellent care with neurology- the work-up they have started is extensive and admittedly more broad than I would have started with-I also think the EMG and potential MRI makes sense.  I encouraged him to complete a work-up they have started-next step would likely be rheumatology.  This could be seronegative rheumatological disease and we will need their opinion.  He is also been placed on prednisone taper which I think is very reasonable.  I wondered about polymyalgia rheumatica but had completely normal ESR and CRP  Other possibilities would be fibromyalgia-like syndrome or viral mediated issues.

## 2018-02-03 NOTE — Progress Notes (Addendum)
[facility]    Provider:  Dr Jaynee Eagles Referring Provider: Marin Olp, MD, Dr. Ernesto Rutherford Primary Care Physician:  Marin Olp, MD  CC:  Muscle and Joint pain diffuse  Interval History: Patient is here with a new referral and has not been seen here in over 3 years. Patient has muscle and joint pain all over. PMHx zoster opthalmicus, disseminated zoster, sinusitis, rectile dysfunction, sneezing. He is here with his wife who also provides information. Joint and muscle pain, shoulders wakes him up at night, upper back, elbow, can't lift him arms up because of pain, moves around, sitting creates pain in the back of the legs, feels like hamstrings, upper back of each leg, stiffness, muscles tight. Touch to put on socks, can;t run anymore. Ibuprofen helps. Brother with similar symptoms. No weight loss. No real weakness more from pain. Brother with similar symptoms. Tight muscles. Biceps on the right and left. No facial weakness. He was having headaches and went to chiropractor and the headaches are gone. Slowly progressive over 8 months. Difficulty with ROM at the hips. He tore his hamstring on the left around the same time it started. His left leg is weak, has to hold it up. Has to lift his leg into the car. Leg muscles tighten up when driving and he has to stop. Dad had arthritis. No dry eyes or mouth, no vision changes, no numbness or tingling.   ROS: Joint pain, back pain, aching muscles, neck pain, neck stiffness all others negative  Reviewed prior physicians and outside notes which includes extensive normal lab testing rheumatoid factor, ANA, B12, sedimentation rate, CRP, TSH, lipid panel, CMP, CBC all earlier this year.  However his B12 was low normal at 294 and there was no methylmalonic acid ordered.  Hemoglobin A1c was normal in 2016.  CRP and ESR were normal in 2016.  Reviewed primary care notes.  Patient's physical exam have been normal in the past.  At his  appointment in February 2019 he did not describe any muscle aches or pains.  In April 2019 he reported symptoms for 8 months when he tore his right hand strain and since then he has had significant difficulty with muscle aches and sores.  Symptoms worse in the morning.  Becoming more persistent.  Aches and pains in the hips, knees, neck, shoulder, elbows.  He saw a Restaurant manager, fast food.  Pain is intermittent in nature.  Diagnosed with polyarthralgia.  Notes when they ordered multiple lab tests including the ones reviewed above.  MRI of the brain in 2016, personally reviewed images, was unremarkable.  HPI :  Daniel Blair is a 52 y.o. male here as a follow up for Herpes zoster Opthalmicus. He felt great after taking the steroids and Valtrex. Since he stopped the pain has returned but not as bad. Was a 10/10 and now is a 2/10. Denies any visual changes or pain on eye movement. He still has a little rash with skin sensitivity and right-sided headache. Discussed sequelae of herpes zoster including post-herpetic neuralgia. Will continue the Valtrex for an additional weekand '20mg'$  prednisone daily.   Initial Visit 04/22/2015: KEONTAY VORA is a 52 y.o. male here as a referral from Dr. Yong Channel for headache.   Started a month ago today. He woke up with mucus, congestion and his head felt like a water bottle. He had a cold. He was prescribed amoxicillin. He felt better on the amoxicillin. It was the worse infection Dr. Ernesto Rutherford ever saw. Gave him a  very strong antibiotic. 5 days later he felt great. This was 2 weeks ago. The prescription ended on a Saturday and he left town on the 14th. By Monday his head hurt again, headache came back. He developed a rash on the right side of his face and Dr. Ernesto Rutherford prescribed Valtrex for possible shingles. Started on Saturday. His head is still throbbing. The headaches are throbbing, not piercing, on the top of the head on the right down the face and forehead, behind the eye. And  sometimes during the day it is by the ear and if he swallows it hurts. He wakes up at 3-4am in themorning and his head is killing him. Continuous all day long, without alleve it is a 9/10. No ligth sensitivity. No hearing changes. No stiffness in the neck. Sometimes turning to the left hurts. Slight nausea. No vomiting. Worse laying down. Better sitting up. He has a tender rash in the upper right of the forehead. Never had headaches before. He is rarely sick. No vision changes. The right side of his jaw hurts however no problems with chewing or jaw claudication. He does endorse tenderness in the right temple area on palpation. The whole right side of the head is hurting him. He can't lie on that side of the face. No vision changes.   Reviewed notes, labs and imaging from outside physicians, which showed:  MRI of the brain (personally reviewed images):  Normal appearance of the brain itself. No intraorbital or orbital apex abnormality seen. Cavernous sinuses appear normal.  Mild mucosal thickening of the paranasal sinuses most notable in the right maxillary and frontal sinuses. Few retention cysts of the maxillary sinus floors.  Review of Systems: Patient complains of symptoms per HPI as well as the following symptoms: eye pain, headache, rash, restless legs. Pertinent negatives per HPI. All others negative.  Social History   Socioeconomic History  . Marital status: Married    Spouse name: Erasmo Downer   . Number of children: 2  . Years of education: 12+  . Highest education level: Not on file  Occupational History    Comment: [employer]  Social Needs  . Financial resource strain: Not on file  . Food insecurity:    Worry: Not on file    Inability: Not on file  . Transportation needs:    Medical: Not on file    Non-medical: Not on file  Tobacco Use  . Smoking status: Never Smoker  . Smokeless tobacco: Never Used  Substance and Sexual Activity  . Alcohol use: Yes    Alcohol/week:  1.2 - 3.6 oz    Types: 2 - 6 Standard drinks or equivalent per week    Comment: 1-2 every other night   . Drug use: No  . Sexual activity: Not on file  Lifestyle  . Physical activity:    Days per week: Not on file    Minutes per session: Not on file  . Stress: Not on file  Relationships  . Social connections:    Talks on phone: Not on file    Gets together: Not on file    Attends religious service: Not on file    Active member of club or organization: Not on file    Attends meetings of clubs or organizations: Not on file    Relationship status: Not on file  . Intimate partner violence:    Fear of current or ex partner: Not on file    Emotionally abused: Not on file  Physically abused: Not on file    Forced sexual activity: Not on file  Other Topics Concern  . Not on file  Social History Narrative   Married. 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       Caffeine use: 3 cup in the morning , no soda      Hobbies: exercise, play golf, read    Family History  Problem Relation Age of Onset  . Heart disease Father        CABG age 17, died 77. Smoker, far different lifestyle.   . Cancer Father        throat, smoker  . Hypertension Other        mother's side    Past Medical History:  Diagnosis Date  . History of shingles     Past Surgical History:  Procedure Laterality Date  . HAND SURGERY Right    2012-7 screws and a pin    Current Outpatient Medications  Medication Sig Dispense Refill  . ibuprofen (ADVIL,MOTRIN) 200 MG tablet Take 200 mg by mouth 2 (two) times daily.    . Ascorbic Acid (VITAMIN C) 100 MG tablet Take 100 mg by mouth daily.    Marland Kitchen aspirin EC 81 MG tablet Take 81 mg by mouth daily.    . fexofenadine (ALLEGRA) 60 MG tablet Take 60 mg by mouth 2 (two) times daily.    . Multiple Vitamin (MULTIVITAMIN WITH MINERALS) TABS tablet Take 1 tablet by mouth daily.    . predniSONE (DELTASONE) 20 MG tablet Take 10m daily with food for 10  days. Then slowly decrease by one pill every 2 days. 90 tablet 3  . sildenafil (VIAGRA) 100 MG tablet Take 1 tablet (100 mg total) by mouth daily as needed for erectile dysfunction. (Patient not taking: Reported on 02/03/2018) 10 tablet 5   No current facility-administered medications for this visit.     Allergies as of 02/03/2018  . (No Known Allergies)    Vitals: BP 124/90 (BP Location: Right Arm, Patient Position: Sitting)   Pulse 63   Ht _0  (1.803 m)   Wt 189 lb (85.7 kg)   BMI 26.36 kg/m  Last Weight:  Wt Readings from Last 1 Encounters:  02/03/18 188 lb 9.6 oz (85.5 kg)   Last Height:   Ht Readings from Last 1 Encounters:  02/03/18 _1  (1.803 m)   Physical exam: Exam: Gen: NAD, conversant, well nourised, obese, well groomed                     CV: RRR, no MRG. No Carotid Bruits. No peripheral edema, warm, nontender Eyes: Conjunctivae clear without exudates or hemorrhage  Neuro: Detailed Neurologic Exam  Speech:    Speech is normal; fluent and spontaneous with normal comprehension.  Cognition:    The patient is oriented to person, place, and time;     recent and remote memory intact;     language fluent;     normal attention, concentration,     fund of knowledge Cranial Nerves:    The pupils are equal, round, and reactive to light. The fundi are normal and spontaneous venous pulsations are present. Visual fields are full to finger confrontation. Extraocular movements are intact. Trigeminal sensation is intact and the muscles of mastication are normal. The face is symmetric. The palate elevates in the midline. Hearing intact. Voice is normal. Shoulder shrug is normal. The tongue has normal  motion without fasciculations.   Coordination:    Normal finger to nose and heel to shin. Normal rapid alternating movements.   Gait:    Heel-toe and tandem gait are normal.   Motor Observation:    No asymmetry, no atrophy, and no involuntary movements noted. Tone:     Normal muscle tone.    Posture:    Posture is normal. normal erect    Strength:    Strength is V/V in the upper and lower limbs.      Sensation: intact to LT     Reflex Exam:  DTR's:    Deep tendon reflexes in the upper and lower extremities are normal bilaterally.   Toes:    The toes are downgoing bilaterally.   Clonus:    Clonus is absent.   Assessment/Plan: 52 year old here as a new consult and new symptoms, muscle and joint pain. PMHx zoster opthalmicus, disseminated zoster, sinusitis, rectile dysfunction, sneezing. Symptoms are diffuse and variable with decreased ROM in the joints, brother with similar symptoms, slowly progressive over 8 months.   - Extensive lab testing; On previous testing B12 was low normal at 294, at this level may still be B12 deficient will reck with other labs and methylmalonic acid.   - XR of pevis to eval for ankylosing spondylitis - EMG/NCS left leg right arm - Consider MRI of the brain and neck after workup above for etiologies such as MS however unlikely - If no etiology found, consider going to Rheumatolgy - Will start steroids to eval for improvement which would point to an inflammatory condition  Addendum: Patient feels extremely better on steroids. So it appears this is inflammatory but I don;t know why, all my lab testing normal. Will discuss with Dr. Yong Channel about sending a rheum consult.  Orders Placed This Encounter  Procedures  . DG Pelvis 1-2 Views  . B12 and Folate Panel  . Methylmalonic acid, serum  . Sedimentation rate  . C-reactive protein  . HLA-B27 Antigen  . Pan-ANCA  . Vitamin B1  . Vitamin B6  . Hepatitis C antibody  . RPR  . Heavy metals, blood  . Multiple Myeloma Panel (SPEP&IFE w/QIG)  . CK  . Comprehensive metabolic panel  . CBC  . Magnesium  . Ferritin  . ANA Comprehensive Panel  . NCV with EMG(electromyography)     Cc: Dr. Ernesto Rutherford and Dr. Cameron Proud, [facility] [address] [place], [place] [address]  Phone [phone] Fax [phone]

## 2018-02-03 NOTE — Assessment & Plan Note (Signed)
S: Blood pressure controlled on no medication.  Down 2 pounds from last visit-has been able to be more active as recovering from hamstring strain BP Readings from Last 3 Encounters:  02/03/18 124/86  02/03/18 124/90  12/23/17 130/80  A/P: We discussed blood pressure goal of <140/90. Continue without medication- seems to have improved with weight loss. Diastolic was 90 on our last check- other BP today was from neurology but not clear if there was a recheck

## 2018-02-04 LAB — ANA COMPREHENSIVE PANEL
Anti JO-1: <0.2 AI (ref 0.0–0.9)
Chromatin Ab SerPl-aCnc: <0.2 AI (ref 0.0–0.9)
dsDNA Ab: <1 IU/mL (ref 0–9)

## 2018-02-05 DIAGNOSIS — M791 Myalgia, unspecified site: Secondary | ICD-10-CM | POA: Insufficient documentation

## 2018-02-06 ENCOUNTER — Telehealth: Payer: Self-pay | Admitting: *Deleted

## 2018-02-06 NOTE — Telephone Encounter (Signed)
LVM informing patient that the xray Images of the hips are normal. Advised Dr Jaynee Eagles stated  this makes ankylosing spondylitis less likely.  Left number for any questions.

## 2018-02-07 LAB — CK: CK TOTAL: 55 U/L (ref 24–204)

## 2018-02-07 LAB — COMPREHENSIVE METABOLIC PANEL
ALBUMIN: 4.2 g/dL (ref 3.5–5.5)
ALT: 15 IU/L (ref 0–44)
AST: 14 IU/L (ref 0–40)
Albumin/Globulin Ratio: 1.6 (ref 1.2–2.2)
Alkaline Phosphatase: 59 IU/L (ref 39–117)
BILIRUBIN TOTAL: 0.3 mg/dL (ref 0.0–1.2)
BUN/Creatinine Ratio: 19 (ref 9–20)
BUN: 18 mg/dL (ref 6–24)
CO2: 26 mmol/L (ref 20–29)
CREATININE: 0.96 mg/dL (ref 0.76–1.27)
Calcium: 9.6 mg/dL (ref 8.7–10.2)
Chloride: 103 mmol/L (ref 96–106)
GFR calc non Af Amer: 91 mL/min/{1.73_m2} (ref >59)
GFR, EST AFRICAN AMERICAN: 105 mL/min/{1.73_m2} (ref >59)
GLUCOSE: 95 mg/dL (ref 65–99)
Globulin, Total: 2.6 g/dL (ref 1.5–4.5)
Potassium: 4.8 mmol/L (ref 3.5–5.2)
Sodium: 142 mmol/L (ref 134–144)
TOTAL PROTEIN: 6.8 g/dL (ref 6.0–8.5)

## 2018-02-07 LAB — CBC
HEMOGLOBIN: 13.5 g/dL (ref 13.0–17.7)
Hematocrit: 42.4 % (ref 37.5–51.0)
MCH: 28.8 pg (ref 26.6–33.0)
MCHC: 31.8 g/dL (ref 31.5–35.7)
MCV: 91 fL (ref 79–97)
Platelets: 276 10*3/uL (ref 150–450)
RBC: 4.68 x10E6/uL (ref 4.14–5.80)
RDW: 14 % (ref 12.3–15.4)
WBC: 6.1 10*3/uL (ref 3.4–10.8)

## 2018-02-07 LAB — MULTIPLE MYELOMA PANEL, SERUM
ALBUMIN SERPL ELPH-MCNC: 3.8 g/dL (ref 2.9–4.4)
Albumin/Glob SerPl: 1.3 (ref 0.7–1.7)
Alpha 1: 0.2 g/dL (ref 0.0–0.4)
Alpha2 Glob SerPl Elph-Mcnc: 0.6 g/dL (ref 0.4–1.0)
B-Globulin SerPl Elph-Mcnc: 1.3 g/dL (ref 0.7–1.3)
Gamma Glob SerPl Elph-Mcnc: 0.9 g/dL (ref 0.4–1.8)
Globulin, Total: 3 g/dL (ref 2.2–3.9)
IGA/IMMUNOGLOBULIN A, SERUM: 382 mg/dL (ref 90–386)
IGM (IMMUNOGLOBULIN M), SRM: 48 mg/dL (ref 20–172)
IgG (Immunoglobin G), Serum: 1037 mg/dL (ref 700–1600)

## 2018-02-07 LAB — PAN-ANCA
ANCA Proteinase 3: <3.5 U/mL (ref 0.0–3.5)
C-ANCA: <1:20 {titer}
Myeloperoxidase Ab: <9 U/mL (ref 0.0–9.0)
P-ANCA: <1:20 {titer}

## 2018-02-07 LAB — VITAMIN B6: VITAMIN B6: 16.3 ug/L (ref 5.3–46.7)

## 2018-02-07 LAB — HEAVY METALS, BLOOD
Arsenic: 5 ug/L (ref 2–23)
LEAD, BLOOD: NOT DETECTED ug/dL (ref 0–4)
Mercury: 2.3 ug/L (ref 0.0–14.9)

## 2018-02-07 LAB — C-REACTIVE PROTEIN: CRP: 4.4 mg/L (ref 0.0–4.9)

## 2018-02-07 LAB — B12 AND FOLATE PANEL
FOLATE: 11.1 ng/mL (ref >3.0)
Vitamin B-12: 347 pg/mL (ref 232–1245)

## 2018-02-07 LAB — HLA-B27 ANTIGEN: HLA B27: NEGATIVE

## 2018-02-07 LAB — MAGNESIUM: MAGNESIUM: 2.2 mg/dL (ref 1.6–2.3)

## 2018-02-07 LAB — METHYLMALONIC ACID, SERUM: METHYLMALONIC ACID: 198 nmol/L (ref 0–378)

## 2018-02-07 LAB — VITAMIN B1: THIAMINE: 127.6 nmol/L (ref 66.5–200.0)

## 2018-02-07 LAB — FERRITIN: Ferritin: 78 ng/mL (ref 30–400)

## 2018-02-07 LAB — HEPATITIS C ANTIBODY

## 2018-02-07 LAB — RPR: RPR: NONREACTIVE

## 2018-02-07 LAB — SEDIMENTATION RATE: Sed Rate: 8 mm/hr (ref 0–30)

## 2018-02-08 ENCOUNTER — Encounter: Payer: Self-pay | Admitting: Neurology

## 2018-02-08 ENCOUNTER — Telehealth: Payer: Self-pay | Admitting: *Deleted

## 2018-02-08 ENCOUNTER — Encounter (INDEPENDENT_AMBULATORY_CARE_PROVIDER_SITE_OTHER): Payer: Self-pay

## 2018-02-08 NOTE — Telephone Encounter (Signed)
-----   Message from Melvenia Beam, MD sent at 02/07/2018  8:35 PM EDT ----- All labs normal thanks

## 2018-02-08 NOTE — Telephone Encounter (Signed)
Called pt and informed him that labs are normal. He verbalized understanding. Also acknowledged email pt had sent just a little bit ago. Told pt will send email to Dr. Jaynee Eagles to respond. We are glad the prednisone is helping. Pt wanted to know what all was being ruled out with the labs and what is next. Pt has upcoming EMG in July. He verbalized understanding. Advised pt that EMG will be helpful information, also will pass question to Dr. Jaynee Eagles to further comment since labs were normal.

## 2018-03-07 ENCOUNTER — Encounter: Payer: Self-pay | Admitting: Family Medicine

## 2018-03-07 DIAGNOSIS — M791 Myalgia, unspecified site: Secondary | ICD-10-CM

## 2018-03-07 DIAGNOSIS — M2550 Pain in unspecified joint: Secondary | ICD-10-CM

## 2018-03-10 NOTE — Telephone Encounter (Signed)
I have faxed the referral details, notes and insurance card to [facility] to [phone] (fax).

## 2018-03-16 ENCOUNTER — Encounter: Payer: Self-pay | Admitting: Neurology

## 2018-03-16 ENCOUNTER — Ambulatory Visit (INDEPENDENT_AMBULATORY_CARE_PROVIDER_SITE_OTHER): Payer: 59 | Admitting: Neurology

## 2018-03-16 ENCOUNTER — Telehealth: Payer: Self-pay | Admitting: Neurology

## 2018-03-16 DIAGNOSIS — G1220 Motor neuron disease, unspecified: Secondary | ICD-10-CM

## 2018-03-16 DIAGNOSIS — M2569 Stiffness of other specified joint, not elsewhere classified: Secondary | ICD-10-CM

## 2018-03-16 DIAGNOSIS — R29898 Other symptoms and signs involving the musculoskeletal system: Secondary | ICD-10-CM | POA: Diagnosis not present

## 2018-03-16 DIAGNOSIS — R531 Weakness: Secondary | ICD-10-CM | POA: Diagnosis not present

## 2018-03-16 DIAGNOSIS — M791 Myalgia, unspecified site: Secondary | ICD-10-CM

## 2018-03-16 DIAGNOSIS — M2550 Pain in unspecified joint: Secondary | ICD-10-CM | POA: Diagnosis not present

## 2018-03-16 DIAGNOSIS — M2560 Stiffness of unspecified joint, not elsewhere classified: Secondary | ICD-10-CM

## 2018-03-16 DIAGNOSIS — Z0289 Encounter for other administrative examinations: Secondary | ICD-10-CM

## 2018-03-16 DIAGNOSIS — R5383 Other fatigue: Secondary | ICD-10-CM

## 2018-03-16 NOTE — Progress Notes (Signed)
Full Name: Daniel Blair Gender: Male MRN #: [id number] Date of Birth: 2065/11/03    Visit Date: 03/16/18 08:13 Age: 52 Years 23 Months Old Examining Physician: Sarina Ill, MD   History: Patient has joint and muscle pain throughout.   Summary: EMG/NCS perform on the right arm and right leg  All nerve and muscles were within normal limits.    Conclusion: This is a normal study.  Sarina Ill, M.D.  [facility] [place], [place] [address] Tel: [phone] Fax: [phone]        [facility]    Nerve / Sites Muscle Latency Ref. Amplitude Ref. Rel Amp Segments Distance Velocity Ref. Area    ms ms mV mV %  cm m/s m/s mVms  R Median - APB     Wrist APB 3.4 ?4.4 6.9 ?4.0 100 Wrist - APB 7   31.0     Upper arm APB 7.3  6.8  98.6 Upper arm - Wrist 22 56 ?49 30.4  R Ulnar - ADM     Wrist ADM 2.8 ?3.3 9.6 ?6.0 100 Wrist - ADM 7   21.2     B.Elbow ADM 6.5  9.0  93.1 B.Elbow - Wrist 20 54 ?49 20.9     A.Elbow ADM 8.3  8.7  97 A.Elbow - B.Elbow 10 53 ?49 20.6         A.Elbow - Wrist      R Peroneal - EDB     Ankle EDB 5.4 ?6.5 4.7 ?2.0 100 Ankle - EDB 9   13.7     Fib head EDB 12.7  4.6  97.7 Fib head - Ankle 34 47 ?44 15.9     Pop fossa EDB 14.7  4.0  88.4 Pop fossa - Fib head 10 49 ?44 15.6         Pop fossa - Ankle      R Tibial - AH     Ankle AH 4.7 ?5.8 11.1 ?4.0 100 Ankle - AH 9   35.2     Pop fossa AH 14.4  8.8  79.8 Pop fossa - Ankle 42 44 ?41 32.1             SNC    Nerve / Sites Rec. Site Peak Lat Ref.  Amp Ref. Segments Distance Peak Diff Ref.    ms ms V V  cm ms ms  R Radial - Anatomical snuff box (Forearm)     Forearm Wrist 2.4 ?2.9 19 ?15 Forearm - Wrist 10    R Sural - Ankle (Calf)     Calf Ankle 4.0 ?4.4 10 ?6 Calf - Ankle 14    R Superficial peroneal - Ankle     Lat leg Ankle 3.6 ?4.4 11 ?6 Lat leg - Ankle 14    R Median, Ulnar - Transcarpal comparison     Median Palm Wrist 2.1 ?2.2 64 ?35 Median Palm - Wrist 8       Ulnar Palm Wrist 2.1 ?2.2 13 ?12 Ulnar Palm - Wrist 8          Median Palm - Ulnar Palm  0.1 ?0.4  R Median - Orthodromic (Dig II, Mid palm)     Dig II Wrist 3.0 ?3.4 34 ?10 Dig II - Wrist 13    R Ulnar - Orthodromic, (Dig V, Mid palm)     Dig V Wrist 2.6 ?3.1 9 ?5 Dig V - Wrist 11  F  Wave    Nerve F Lat Ref.   ms ms  R Ulnar - ADM 29.5 ?32.0  R Tibial - AH 51.1 ?56.0         EMG full       EMG Summary Table    Spontaneous MUAP Recruitment  Muscle IA Fib PSW Fasc Other Amp Dur. Poly Pattern  R. Deltoid Normal None None None _______ Normal Normal Normal Normal  R. Triceps brachii Normal None None None _______ Normal Normal Normal Normal  R. Pronator teres Normal None None None _______ Normal Normal Normal Normal  R. Biceps brachii Normal None None None _______ Normal Normal Normal Normal  R. First dorsal interosseous Normal None None None _______ Normal Normal Normal Normal  R. Cervical paraspinals (low) Normal None None None _______ Normal Normal Normal Normal  R. Iliopsoas Normal None None None _______ Normal Normal Normal Normal  R. Vastus medialis Normal None None None _______ Normal Normal Normal Normal  R. Tibialis anterior Normal None None None _______ Normal Normal Normal Normal  R. Gastrocnemius (Medial head) Normal None None None _______ Normal Normal Normal Normal  R. Lumbar paraspinals (low) Normal None None None _______ Normal Normal Normal Normal

## 2018-03-16 NOTE — Patient Instructions (Signed)
MRI brain and cervical spine Labs today Rheumatology referral

## 2018-03-16 NOTE — Progress Notes (Signed)
History: Patient has joint and muscle pain throughout. Discussed EMG/NCS findings today normal. Extensive workup has been unrevealing including extensive labwork(ANA comprehensive panel, ferritin, magnesium, CBC, CMP, CK, multiple myeloma panel, heavy metals, RPR, hep C, vitamin B6, vitamin B1, pan ANCA, HLA-B27, CRP, sed rate, methylmalonic acid, B12 and folate, rheumatoid factor, CRP, TSH), xr pelvis. Discussed workup, next step is MRI brain and cervical spine.    MRI brain and cervical spine for MS, motor neuron disease, cervical myelopathy or cervical MS plaques or other etiology of weakness and muscle pain as all other workup unremarkable (extensive)  He has been referred to Rheumatology by pcp. Also recommend physical therapy.    Orders Placed This Encounter  Procedures  . MR BRAIN W WO CONTRAST  . MR CERVICAL SPINE W WO CONTRAST  . B. burgdorfi Antibody  . HLA-B27 Antigen  . Ambulatory referral to Physical Therapy   A total of 25 minutes was spent face-to-face with this patient. Over half this time was spent on counseling patient on the  1. Weakness   2. Muscle pain   3. Arthralgia, unspecified joint   4. Left leg weakness   5. Decreased ROM of trunk and back   6. Motor neuron disease ([place])   7. Other fatigue   8. Muscle function loss    diagnosis and different diagnostic and therapeutic options, counseling and coordination of care, risks ans benefits of management, compliance, or risk factor reduction and education.  This does not include time spent on emg/ncs.  CC: Dr. Amil Amen, Dr. Yong Channel

## 2018-03-16 NOTE — Procedures (Signed)
Full Name: Daniel Blair Gender: Male MRN #: [id number] Date of Birth: 03-29-2066    Visit Date: 03/16/18 08:13 Age: 52 Years 43 Months Old Examining Physician: Sarina Ill, MD   History: Patient has joint and muscle pain throughout.   Summary: EMG/NCS perform on the right arm and right leg  All nerves and muscles were within normal limits.    Conclusion: This is a normal study.  Sarina Ill, M.D.  CC: Dr. Amil Amen, Dr[facility] [place], [place] [address] Tel: [phone] Fax: [phone]        [facility]    Nerve / Sites Muscle Latency Ref. Amplitude Ref. Rel Amp Segments Distance Velocity Ref. Area    ms ms mV mV %  cm m/s m/s mVms  R Median - APB     Wrist APB 3.4 ?4.4 6.9 ?4.0 100 Wrist - APB 7   31.0     Upper arm APB 7.3  6.8  98.6 Upper arm - Wrist 22 56 ?49 30.4  R Ulnar - ADM     Wrist ADM 2.8 ?3.3 9.6 ?6.0 100 Wrist - ADM 7   21.2     B.Elbow ADM 6.5  9.0  93.1 B.Elbow - Wrist 20 54 ?49 20.9     A.Elbow ADM 8.3  8.7  97 A.Elbow - B.Elbow 10 53 ?49 20.6         A.Elbow - Wrist      R Peroneal - EDB     Ankle EDB 5.4 ?6.5 4.7 ?2.0 100 Ankle - EDB 9   13.7     Fib head EDB 12.7  4.6  97.7 Fib head - Ankle 34 47 ?44 15.9     Pop fossa EDB 14.7  4.0  88.4 Pop fossa - Fib head 10 49 ?44 15.6         Pop fossa - Ankle      R Tibial - AH     Ankle AH 4.7 ?5.8 11.1 ?4.0 100 Ankle - AH 9   35.2     Pop fossa AH 14.4  8.8  79.8 Pop fossa - Ankle 42 44 ?41 32.1             SNC    Nerve / Sites Rec. Site Peak Lat Ref.  Amp Ref. Segments Distance Peak Diff Ref.    ms ms V V  cm ms ms  R Radial - Anatomical snuff box (Forearm)     Forearm Wrist 2.4 ?2.9 19 ?15 Forearm - Wrist 10    R Sural - Ankle (Calf)     Calf Ankle 4.0 ?4.4 10 ?6 Calf - Ankle 14    R Superficial peroneal - Ankle     Lat leg Ankle 3.6 ?4.4 11 ?6 Lat leg - Ankle 14    R Median, Ulnar - Transcarpal comparison     Median Palm Wrist 2.1 ?2.2  64 ?35 Median Palm - Wrist 8       Ulnar Palm Wrist 2.1 ?2.2 13 ?12 Ulnar Palm - Wrist 8          Median Palm - Ulnar Palm  0.1 ?0.4  R Median - Orthodromic (Dig II, Mid palm)     Dig II Wrist 3.0 ?3.4 34 ?10 Dig II - Wrist 13    R Ulnar - Orthodromic, (Dig V, Mid palm)     Dig V Wrist 2.6 ?3.1 9 ?5 Dig  V - Wrist 11                   F  Wave    Nerve F Lat Ref.   ms ms  R Ulnar - ADM 29.5 ?32.0  R Tibial - AH 51.1 ?56.0         EMG full       EMG Summary Table    Spontaneous MUAP Recruitment  Muscle IA Fib PSW Fasc Other Amp Dur. Poly Pattern  R. Deltoid Normal None None None _______ Normal Normal Normal Normal  R. Triceps brachii Normal None None None _______ Normal Normal Normal Normal  R. Pronator teres Normal None None None _______ Normal Normal Normal Normal  R. Biceps brachii Normal None None None _______ Normal Normal Normal Normal  R. First dorsal interosseous Normal None None None _______ Normal Normal Normal Normal  R. Cervical paraspinals (low) Normal None None None _______ Normal Normal Normal Normal  R. Iliopsoas Normal None None None _______ Normal Normal Normal Normal  R. Vastus medialis Normal None None None _______ Normal Normal Normal Normal  R. Tibialis anterior Normal None None None _______ Normal Normal Normal Normal  R. Gastrocnemius (Medial head) Normal None None None _______ Normal Normal Normal Normal  R. Lumbar paraspinals (low) Normal None None None _______ Normal Normal Normal Normal

## 2018-03-16 NOTE — Telephone Encounter (Signed)
[facility] Brain pending faxed clinical notes case # [id number]  MR Cervical spine auth: [id number] (exp. 03/16/18 to 04/30/18)

## 2018-03-17 NOTE — Telephone Encounter (Signed)
MR Brain w/wo contrast & MR Cervical spine w/wo contrast Dr. Jaynee Eagles [facility] Auth: [id number] & [id number] (ex[. 03/16/18 to 04/30/18). Patient is scheduled at [facility] for 03/28/18.

## 2018-03-17 NOTE — Telephone Encounter (Signed)
[employer] Brain Josem Kaufmann: [id number] (exp. 03/16/18 to 04/30/18)

## 2018-03-20 ENCOUNTER — Telehealth: Payer: Self-pay | Admitting: *Deleted

## 2018-03-20 NOTE — Telephone Encounter (Signed)
Copied from [place]. Topic: Inquiry >> Mar 15, 2018 11:46 AM Burchel, Abbi R wrote: Please pt's send most recent labs to Dr Amil Amen at [facility].

## 2018-03-20 NOTE — Telephone Encounter (Signed)
Labs faxed to Dr. Amil Amen at [facility].

## 2018-03-22 LAB — HLA-B27 ANTIGEN: HLA B27: NEGATIVE

## 2018-03-22 LAB — B. BURGDORFI ANTIBODIES: Lyme IgG/IgM Ab: <0.91 {ISR} (ref 0.00–0.90)

## 2018-03-24 ENCOUNTER — Telehealth: Payer: Self-pay | Admitting: Neurology

## 2018-03-24 NOTE — Telephone Encounter (Signed)
Pt is asking for a call back from [place] [facility] to reschedule his MRI

## 2018-03-28 ENCOUNTER — Other Ambulatory Visit: Payer: 59

## 2018-04-03 NOTE — Telephone Encounter (Signed)
I left a voicemail for the patient to call me back about r/s MRI's.

## 2018-04-10 ENCOUNTER — Ambulatory Visit: Payer: Managed Care, Other (non HMO) | Admitting: Neurology

## 2018-05-26 ENCOUNTER — Encounter: Payer: Self-pay | Admitting: Family Medicine

## 2018-05-26 DIAGNOSIS — M2550 Pain in unspecified joint: Secondary | ICD-10-CM | POA: Diagnosis not present

## 2018-05-26 DIAGNOSIS — R5383 Other fatigue: Secondary | ICD-10-CM | POA: Diagnosis not present

## 2018-05-31 ENCOUNTER — Other Ambulatory Visit: Payer: Self-pay | Admitting: Internal Medicine

## 2018-05-31 DIAGNOSIS — M545 Low back pain, unspecified: Secondary | ICD-10-CM

## 2018-06-04 ENCOUNTER — Other Ambulatory Visit: Payer: 59

## 2018-06-05 ENCOUNTER — Ambulatory Visit: Payer: Self-pay | Admitting: *Deleted

## 2018-06-05 NOTE — Telephone Encounter (Signed)
Patient has been monitoring his b/p over the last several days and getting elevated readings. He denies CP/H/A/SOB/vision changes Stated he has a stressful job and is possible he has increased the sodium in his diet recently. He has begun to exercise recently and this is a stress reliever for him. Reviewed s/sx that would require immediate attention. Advised reducing sodium intake. Appointment made for tomorrow with PCP. Reason for Disposition . Systolic BP  >= 794 OR Diastolic >= 327  Answer Assessment - Initial Assessment Questions 1. BLOOD PRESSURE: "What is the blood pressure?" "Did you take at least two measurements 5 minutes apart?"     614-709 systolic 295-747 diastolic over the last 3 days not including today.  2. ONSET: "When did you take your blood pressure?"     At home 3. HOW: "How did you obtain the blood pressure?" (e.g., visiting nurse, automatic home BP monitor)     Home cuff 4. HISTORY: "Do you have a history of high blood pressure?"     no 5. MEDICATIONS: "Are you taking any medications for blood pressure?" "Have you missed any doses recently?"    no 6. OTHER SYMPTOMS: "Do you have any symptoms?" (e.g., headache, chest pain, blurred vision, difficulty breathing, weakness)     no 7. PREGNANCY: "Is there any chance you are pregnant?" "When was your last menstrual period?"     na  Protocols used: HIGH BLOOD PRESSURE-A-AH

## 2018-06-06 ENCOUNTER — Ambulatory Visit: Payer: 59 | Admitting: Family Medicine

## 2018-06-06 ENCOUNTER — Telehealth: Payer: Self-pay | Admitting: Family Medicine

## 2018-06-06 ENCOUNTER — Encounter: Payer: Self-pay | Admitting: Family Medicine

## 2018-06-06 VITALS — BP 134/92 | HR 74 | Temp 98.0°F | Ht 71.0 in | Wt 187.0 lb

## 2018-06-06 DIAGNOSIS — R030 Elevated blood-pressure reading, without diagnosis of hypertension: Secondary | ICD-10-CM

## 2018-06-06 DIAGNOSIS — Z23 Encounter for immunization: Secondary | ICD-10-CM

## 2018-06-06 DIAGNOSIS — R0683 Snoring: Secondary | ICD-10-CM

## 2018-06-06 LAB — BASIC METABOLIC PANEL
BUN: 18 mg/dL (ref 6–23)
CHLORIDE: 101 meq/L (ref 96–112)
CO2: 29 meq/L (ref 19–32)
Calcium: 9.4 mg/dL (ref 8.4–10.5)
Creatinine, Ser: 0.97 mg/dL (ref 0.40–1.50)
GFR: 86.08 mL/min (ref >60.00)
GLUCOSE: 97 mg/dL (ref 70–99)
POTASSIUM: 4.3 meq/L (ref 3.5–5.1)
Sodium: 136 mEq/L (ref 135–145)

## 2018-06-06 NOTE — Progress Notes (Signed)
Subjective:  Daniel Blair is a 52 y.o. year old very pleasant male patient who presents for/with See problem oriented charting ROS- No chest pain or shortness of breath. No headache or blurry vision.    Past Medical History-  Patient Active Problem List   Diagnosis Date Noted  . Herpes zoster ophthalmicus 04/24/2015    Priority: Medium  . Hamstring strain, initial encounter 09/06/2017    Priority: Low  . Myalgia 02/05/2018  . Arthralgia 02/03/2018  . Sneezing 09/30/2017  . Erectile dysfunction 09/30/2017  . Elevated blood pressure reading 09/30/2017    Medications- reviewed and updated Current Outpatient Medications  Medication Sig Dispense Refill  . Ascorbic Acid (VITAMIN C) 100 MG tablet Take 100 mg by mouth daily.    Marland Kitchen aspirin EC 81 MG tablet Take 81 mg by mouth daily.    . fexofenadine (ALLEGRA) 60 MG tablet Take 60 mg by mouth 2 (two) times daily.    . meloxicam (MOBIC) 15 MG tablet Take 15 mg by mouth daily.  2  . sildenafil (VIAGRA) 100 MG tablet Take 1 tablet (100 mg total) by mouth daily as needed for erectile dysfunction. 10 tablet 5   No current facility-administered medications for this visit.     Objective: BP (!) 134/92 (BP Location: Left Arm, Patient Position: Sitting, Cuff Size: Large)   Pulse 74   Temp 98 F (36.7 C) (Oral)   Ht 5\' 11"  (1.803 m)   Wt 187 lb (84.8 kg)   SpO2 96%   BMI 26.08 kg/m  Gen: NAD, resting comfortably CV: RRR no murmurs rubs or gallops Lungs: CTAB no crackles, wheeze, rhonchi Abdomen: /nondistended, largely normal weight Ext: no edema Skin: warm, dry Neuro: speech  normal, moves all extremities, gets onto table without difficulty  Assessment/Plan:  Elevated blood pressure reading - Plan: Basic metabolic panel S: poorly controlled on no rx.   Noted 140 # at rheumatologist. Bought machine at home and #s at 148-149 at home- noted a # even over 150. Mom has been on medicine for blood pressure since she was 44- has been on  lisinopril 20mg .   Already walks 40 minutes 5 days a week. Eats reasonably healthy diet.   He reflects  Later in conversation that he has used Human resources officer D 5 dayin a row recently.   Other notes-  wife is hearing heavy snoring. No daytime sleepiness but can fall asleep easily at night. Wife states snoring has progressed and is much louder- waking her up multiple times a night when it didn't prior  I got 148/98 and home cuff 144/116 BP Readings from Last 3 Encounters:  06/06/18 LPN check- (!) 786/76. Compared to 150/116 with home cuff.   02/03/18 124/86  02/03/18 124/90  A/P: We discussed blood pressure goal of <140/90. Home cuff dose seem slightly high- Consider omron cuff.   From avs "Stop allegra-D. I want you to update me in 7 days with how your #s are looking at home through [place]  Next step- Start lisinopril 10mg  through mychart if #s remain elevated off of allegra D   Please stop by lab before you go  See me in 2 weeks for BP recheck and if on lisinopril another kidney check. Goal BP <140/90  Perhaps give me 1-2 readings before the meloxicam to see if it is better  Exercise is already excellent but if you want to intensify without adding load to your joints like biking that is reasonable  We will call you within  two weeks about your referral to pulmonary. If you do not hear within 3 weeks, give [place] a call. "  Refer to pulmonary for possible OSA- which could contribute to hypertension. Getting bmet in case we start lisinopril for new baseline. We discussed if BP remains high would diagnose as hypertension  Future Appointments  Date Time Provider [place]  06/11/2018  2:40 PM GI-315 MR 3 [id number] [facility]  06/21/2018  9:30 AM Marin Olp, MD [facility]  10/09/2018  8:40 AM Marin Olp, MD [facility]   Lab/Order associations: Need for prophylactic vaccination and inoculation against influenza - Plan: Flu Vaccine QUAD 36+ mos IM  Elevated blood  pressure reading - Plan: Basic metabolic panel  Snoring - Plan: Ambulatory referral to Pulmonology  Time Stamp The duration of face-to-face time during this visit was greater than 25 minutes. Greater than 50% of this time was spent in counseling, explanation of diagnosis, planning of further management, and/or coordination of care including discussing possible causes of high blood pressure, possible treatments and plans for treatment (med adjustments and potential next steps).    Return precautions advised.  Garret Reddish, MD

## 2018-06-06 NOTE — Patient Instructions (Addendum)
Thanks for doing flu shot   Stop allegra-D. I want you to update me in 7 days with how your #s are looking at home through [place]  Next step- Start lisinopril 10mg  through mychart if #s remain elevated off of allegra D   Please stop by lab before you go  See me in 2 weeks for BP recheck and if on lisinopril another kidney check. Goal BP <140/90  Perhaps give me 1-2 readings before the meloxicam to see if it is better  Exercise is already excellent but if you want to intensify without adding load to your joints like biking that is reasonable  We will call you within two weeks about your referral to pulmonary. If you do not hear within 3 weeks, give [place] a call.     DASH Eating Plan DASH stands for "Dietary Approaches to Stop Hypertension." The DASH eating plan is a healthy eating plan that has been shown to reduce high blood pressure (hypertension). It may also reduce your risk for type 2 diabetes, heart disease, and stroke. The DASH eating plan may also help with weight loss. What are tips for following this plan? General guidelines  Avoid eating more than 2,300 mg (milligrams) of salt (sodium) a day. If you have hypertension, you may need to reduce your sodium intake to 1,500 mg a day.  Limit alcohol intake to no more than 1 drink a day for nonpregnant women and 2 drinks a day for men. One drink equals 12 oz of beer, 5 oz of wine, or 1 oz of hard liquor.  Work with your health care provider to maintain a healthy body weight or to lose weight. Ask what an ideal weight is for you.  Get at least 30 minutes of exercise that causes your heart to beat faster (aerobic exercise) most days of the week. Activities may include walking, swimming, or biking.  Work with your health care provider or diet and nutrition specialist (dietitian) to adjust your eating plan to your individual calorie needs. Reading food labels  Check food labels for the amount of sodium per serving. Choose foods with  less than 5 percent of the Daily Value of sodium. Generally, foods with less than 300 mg of sodium per serving fit into this eating plan.  To find whole grains, look for the word "whole" as the first word in the ingredient list. Shopping  Buy products labeled as "low-sodium" or "no salt added."  Buy fresh foods. Avoid canned foods and premade or frozen meals. Cooking  Avoid adding salt when cooking. Use salt-free seasonings or herbs instead of table salt or sea salt. Check with your health care provider or pharmacist before using salt substitutes.  Do not fry foods. Cook foods using healthy methods such as baking, boiling, grilling, and broiling instead.  Cook with heart-healthy oils, such as olive, canola, soybean, or sunflower oil. Meal planning   Eat a balanced diet that includes: ? 5 or more servings of fruits and vegetables each day. At each meal, try to fill half of your plate with fruits and vegetables. ? Up to 6-8 servings of whole grains each day. ? Less than 6 oz of lean meat, poultry, or fish each day. A 3-oz serving of meat is about the same size as a deck of cards. One egg equals 1 oz. ? 2 servings of low-fat dairy each day. ? A serving of nuts, seeds, or beans 5 times each week. ? Heart-healthy fats. Healthy fats called Omega-3 fatty  acids are found in foods such as flaxseeds and coldwater fish, like sardines, salmon, and mackerel.  Limit how much you eat of the following: ? Canned or prepackaged foods. ? Food that is high in trans fat, such as fried foods. ? Food that is high in saturated fat, such as fatty meat. ? Sweets, desserts, sugary drinks, and other foods with added sugar. ? Full-fat dairy products.  Do not salt foods before eating.  Try to eat at least 2 vegetarian meals each week.  Eat more home-cooked food and less restaurant, buffet, and fast food.  When eating at a restaurant, ask that your food be prepared with less salt or no salt, if  possible. What foods are recommended? The items listed may not be a complete list. Talk with your dietitian about what dietary choices are best for you. Grains Whole-grain or whole-wheat bread. Whole-grain or whole-wheat pasta. Brown rice. [place]. Bulgur. Whole-grain and low-sodium cereals. Pita bread. Low-fat, low-sodium crackers. Whole-wheat flour tortillas. Vegetables Fresh or frozen vegetables (raw, steamed, roasted, or grilled). Low-sodium or reduced-sodium tomato and vegetable juice. Low-sodium or reduced-sodium tomato sauce and tomato paste. Low-sodium or reduced-sodium canned vegetables. Fruits All fresh, dried, or frozen fruit. Canned fruit in natural juice (without added sugar). Meat and other protein foods Skinless chicken or [place]. Ground chicken or [place]. Pork with fat trimmed off. Fish and seafood. Egg whites. Dried beans, peas, or lentils. Unsalted nuts, nut butters, and seeds. Unsalted canned beans. Lean cuts of beef with fat trimmed off. Low-sodium, lean deli meat. Dairy Low-fat (1%) or fat-free (skim) milk. Fat-free, low-fat, or reduced-fat cheeses. Nonfat, low-sodium ricotta or cottage cheese. Low-fat or nonfat yogurt. Low-fat, low-sodium cheese. Fats and oils Soft margarine without trans fats. Vegetable oil. Low-fat, reduced-fat, or light mayonnaise and salad dressings (reduced-sodium). Canola, safflower, olive, soybean, and sunflower oils. Avocado. Seasoning and other foods Herbs. Spices. Seasoning mixes without salt. Unsalted popcorn and pretzels. Fat-free sweets. What foods are not recommended? The items listed may not be a complete list. Talk with your dietitian about what dietary choices are best for you. Grains Baked goods made with fat, such as croissants, muffins, or some breads. Dry pasta or rice meal packs. Vegetables Creamed or fried vegetables. Vegetables in a cheese sauce. Regular canned vegetables (not low-sodium or reduced-sodium). Regular canned  tomato sauce and paste (not low-sodium or reduced-sodium). Regular tomato and vegetable juice (not low-sodium or reduced-sodium). Angie Fava. Olives. Fruits Canned fruit in a light or heavy syrup. Fried fruit. Fruit in cream or butter sauce. Meat and other protein foods Fatty cuts of meat. Ribs. Fried meat. Berniece Salines. Sausage. Bologna and other processed lunch meats. Salami. Fatback. Hotdogs. Bratwurst. Salted nuts and seeds. Canned beans with added salt. Canned or smoked fish. Whole eggs or egg yolks. Chicken or [place] with skin. Dairy Whole or 2% milk, cream, and half-and-half. Whole or full-fat cream cheese. Whole-fat or sweetened yogurt. Full-fat cheese. Nondairy creamers. Whipped toppings. Processed cheese and cheese spreads. Fats and oils Butter. Stick margarine. Lard. Shortening. Ghee. Bacon fat. Tropical oils, such as coconut, palm kernel, or palm oil. Seasoning and other foods Salted popcorn and pretzels. Onion salt, garlic salt, seasoned salt, table salt, and sea salt. Worcestershire sauce. Tartar sauce. Barbecue sauce. Teriyaki sauce. Soy sauce, including reduced-sodium. Steak sauce. Canned and packaged gravies. Fish sauce. Oyster sauce. Cocktail sauce. Horseradish that you find on the shelf. Ketchup. Mustard. Meat flavorings and tenderizers. Bouillon cubes. Hot sauce and Tabasco sauce. Premade or packaged marinades. Premade or packaged  taco seasonings. Relishes. Regular salad dressings. Where to find more information:  National Heart, Lung, and [place]: https://wilson-eaton.com/  American Heart Association: www.heart.org Summary  The DASH eating plan is a healthy eating plan that has been shown to reduce high blood pressure (hypertension). It may also reduce your risk for type 2 diabetes, heart disease, and stroke.  With the DASH eating plan, you should limit salt (sodium) intake to 2,300 mg a day. If you have hypertension, you may need to reduce your sodium intake to 1,500 mg a day.  When  on the DASH eating plan, aim to eat more fresh fruits and vegetables, whole grains, lean proteins, low-fat dairy, and heart-healthy fats.  Work with your health care provider or diet and nutrition specialist (dietitian) to adjust your eating plan to your individual calorie needs. This information is not intended to replace advice given to you by your health care provider. Make sure you discuss any questions you have with your health care provider. Document Released: 08/05/2011 Document Revised: 08/09/2016 Document Reviewed: 08/09/2016 Elsevier Interactive Patient Education  [employer].

## 2018-06-06 NOTE — Telephone Encounter (Signed)
LVM on patient and wife's phone that the $50 no show fee for the date of service 01-20-18 has been waived out of courtesy per [employer]. No further action required.

## 2018-06-09 ENCOUNTER — Other Ambulatory Visit: Payer: 59

## 2018-06-11 ENCOUNTER — Ambulatory Visit
Admission: RE | Admit: 2018-06-11 | Discharge: 2018-06-11 | Disposition: A | Discharge status: Home / Self Care | Payer: 59 | Source: Ambulatory Visit | Attending: Internal Medicine | Admitting: Internal Medicine

## 2018-06-11 DIAGNOSIS — M545 Low back pain, unspecified: Secondary | ICD-10-CM

## 2018-06-11 DIAGNOSIS — S76312A Strain of muscle, fascia and tendon of the posterior muscle group at thigh level, left thigh, initial encounter: Secondary | ICD-10-CM | POA: Diagnosis not present

## 2018-06-11 DIAGNOSIS — S76311A Strain of muscle, fascia and tendon of the posterior muscle group at thigh level, right thigh, initial encounter: Secondary | ICD-10-CM | POA: Diagnosis not present

## 2018-06-13 ENCOUNTER — Encounter: Payer: Self-pay | Admitting: Family Medicine

## 2018-06-13 ENCOUNTER — Other Ambulatory Visit: Payer: Self-pay | Admitting: Family Medicine

## 2018-06-13 MED ORDER — AMLODIPINE BESYLATE 5 MG PO TABS: 5.0000 mg | ORAL_TABLET | Freq: Every day | ORAL | 3 refills | Status: DC

## 2018-06-21 ENCOUNTER — Encounter: Payer: Self-pay | Admitting: Family Medicine

## 2018-06-21 ENCOUNTER — Ambulatory Visit: Payer: 59 | Admitting: Family Medicine

## 2018-06-21 VITALS — BP 138/92 | HR 63 | Temp 98.0°F | Ht 71.0 in | Wt 187.8 lb

## 2018-06-21 DIAGNOSIS — I10 Essential (primary) hypertension: Secondary | ICD-10-CM | POA: Diagnosis not present

## 2018-06-21 NOTE — Progress Notes (Signed)
Subjective:  Daniel Blair is a 52 y.o. year old very pleasant male patient who presents for/with See problem oriented charting ROS- No chest pain or shortness of breath. No headache or blurry vision.    Past Medical History-  Patient Active Problem List   Diagnosis Date Noted  . Herpes zoster ophthalmicus 04/24/2015    Priority: Medium  . Hamstring strain, initial encounter 09/06/2017    Priority: Low  . Myalgia 02/05/2018  . Arthralgia 02/03/2018  . Sneezing 09/30/2017  . Erectile dysfunction 09/30/2017  . Essential hypertension 09/30/2017    Medications- reviewed and updated Current Outpatient Medications  Medication Sig Dispense Refill  . amLODipine (NORVASC) 5 MG tablet Take 1 tablet (5 mg total) by mouth daily. 30 tablet 3  . meloxicam (MOBIC) 15 MG tablet Take 15 mg by mouth daily.  2  . sildenafil (VIAGRA) 100 MG tablet Take 1 tablet (100 mg total) by mouth daily as needed for erectile dysfunction. (Patient not taking: Reported on 06/21/2018) 10 tablet 5   No current facility-administered medications for this visit.     Objective: BP (!) 138/92 (BP Location: Left Arm, Patient Position: Sitting, Cuff Size: Normal)   Pulse 63   Temp 98 F (36.7 C) (Oral)   Ht 5\' 11"  (1.803 m)   Wt 187 lb 12.8 oz (85.2 kg)   SpO2 96%   BMI 26.19 kg/m  Gen: NAD, resting comfortably CV: RRR no murmurs rubs or gallops Lungs: CTAB no crackles, wheeze, rhonchi Ext: no edema Skin: warm, dry Neuro: speech normal, moves all extremities  Assessment/Plan:  Essential hypertension S: Formally changing diagnosis today to hypertension.    Home numbers may have been mildly elevated on systolic number up to 175 but diastolic numbers were up in the low 100s.  We started him on amlodipine 5 mg and numbers began to trend down to 135/94 on most recent measure about a week ago through my chart.  Looking back at last visit I had mentioned starting lisinopril as that has been effective for a family  member but did not reference this at time of my chart communication.  Apologized to patient for this inconsistency.  Patient was okay with medication change  Today, mild poorly Controlled on amlodipine 5 mg.  Of note he is on meloxicam at least through November 4 through rheumatologist for tendinosis noted on pelvic MRI  135/103 on home cuff then I rechecked and 138/102 on right arm for both.  Left arm home cuff 141/97 and our recheck was 136/98.   Home average since starting medication is 142/96. No longer seeing #s up in 102H on diastolic though which is an improvement.   Mild swelling in fingers on amlodipine. Trying to cut salt.  BP Readings from Last 3 Encounters:  06/21/18 (!) 138/92  06/06/18 (!) 134/92  02/03/18 124/86  A/P: We discussed blood pressure goal of <140/90. Continue current meds: He honestly has a rather maximized lifestyle already.  I am hoping with more time on amlodipine and him coming off meloxicam in early November that his numbers will improve-if they do not consider increasing amlodipine to 10 mg or adding low-dose lisinopril.  Future Appointments  Date Time Provider [place]  10/09/2018  8:40 AM Marin Olp, MD [facility]  12/18/2018  8:20 AM Marin Olp, MD [facility]   Time Stamp The duration of face-to-face time during this visit was greater than 15 minutes. Greater than 50% of this time was spent in counseling,  explanation of diagnosis, planning of further management, and/or coordination of care including discussing blood pressure goals, discussing evidence for lifestyle modifications and how this may help patient-we reviewed an AAFP article together and I gave him a copy for home reading, discussing potential next steps  Return precautions advised.  Garret Reddish, MD

## 2018-06-21 NOTE — Patient Instructions (Addendum)
Health Maintenance Due  Topic Date Due  . COLONOSCOPY - call to get colonoscopy set up 07/10/2015    Continue current medications- amlodipine alone for blood pressure at 5 mg  Continue regular exercise, limit sodium to 2400mg  per day or 1500 if want to be extra aggressive  Hoping once you get this in system longer and off mobic that blood pressure comes down. I would like an update perhaps 7-10 days after your rheumatology visit with how your #s are doing and if they were able to take you off mobic

## 2018-06-21 NOTE — Assessment & Plan Note (Addendum)
S: Formally changing diagnosis today to hypertension.    Home numbers may have been mildly elevated on systolic number up to 747 but diastolic numbers were up in the low 100s.  We started him on amlodipine 5 mg and numbers began to trend down to 135/94 on most recent measure about a week ago through my chart.  Looking back at last visit I had mentioned starting lisinopril as that has been effective for a family member but did not reference this at time of my chart communication.  Apologized to patient for this inconsistency.  Patient was okay with medication change  Today, mild poorly Controlled on amlodipine 5 mg.  Of note he is on meloxicam at least through November 4 through rheumatologist for tendinosis noted on pelvic MRI  135/103 on home cuff then I rechecked and 138/102 on right arm for both.  Left arm home cuff 141/97 and our recheck was 136/98.   Home average since starting medication is 142/96. No longer seeing #s up in 159B on diastolic though which is an improvement.   Mild swelling in fingers on amlodipine. Trying to cut salt.  BP Readings from Last 3 Encounters:  06/21/18 (!) 138/92  06/06/18 (!) 134/92  02/03/18 124/86  A/P: We discussed blood pressure goal of <140/90. Continue current meds: He honestly has a rather maximized lifestyle already.  I am hoping with more time on amlodipine and him coming off meloxicam in early November that his numbers will improve-if they do not consider increasing amlodipine to 10 mg or adding low-dose lisinopril.  He is going to update me 7 to 10 days after his next rheumatology visit through my chart and we will make follow-up decisions at that time

## 2018-07-03 DIAGNOSIS — M2550 Pain in unspecified joint: Secondary | ICD-10-CM | POA: Diagnosis not present

## 2018-07-25 DIAGNOSIS — M353 Polymyalgia rheumatica: Secondary | ICD-10-CM | POA: Diagnosis not present

## 2018-07-25 DIAGNOSIS — Z7952 Long term (current) use of systemic steroids: Secondary | ICD-10-CM | POA: Diagnosis not present

## 2018-08-04 ENCOUNTER — Other Ambulatory Visit: Payer: Self-pay | Admitting: Family Medicine

## 2018-08-31 DIAGNOSIS — M353 Polymyalgia rheumatica: Secondary | ICD-10-CM | POA: Diagnosis not present

## 2018-08-31 DIAGNOSIS — Z7952 Long term (current) use of systemic steroids: Secondary | ICD-10-CM | POA: Diagnosis not present

## 2018-08-31 DIAGNOSIS — M79671 Pain in right foot: Secondary | ICD-10-CM | POA: Diagnosis not present

## 2018-10-03 ENCOUNTER — Encounter: Payer: 59 | Admitting: Family Medicine

## 2018-10-09 ENCOUNTER — Encounter: Payer: Self-pay | Admitting: Family Medicine

## 2018-10-09 ENCOUNTER — Ambulatory Visit (INDEPENDENT_AMBULATORY_CARE_PROVIDER_SITE_OTHER): Payer: 59 | Admitting: Family Medicine

## 2018-10-09 VITALS — BP 110/80 | HR 61 | Temp 98.1°F | Ht 71.0 in | Wt 190.1 lb

## 2018-10-09 DIAGNOSIS — Z6826 Body mass index (BMI) 26.0-26.9, adult: Secondary | ICD-10-CM | POA: Diagnosis not present

## 2018-10-09 DIAGNOSIS — M353 Polymyalgia rheumatica: Secondary | ICD-10-CM

## 2018-10-09 DIAGNOSIS — Z0000 Encounter for general adult medical examination without abnormal findings: Secondary | ICD-10-CM

## 2018-10-09 DIAGNOSIS — Z1211 Encounter for screening for malignant neoplasm of colon: Secondary | ICD-10-CM | POA: Diagnosis not present

## 2018-10-09 DIAGNOSIS — I10 Essential (primary) hypertension: Secondary | ICD-10-CM | POA: Diagnosis not present

## 2018-10-09 DIAGNOSIS — Z125 Encounter for screening for malignant neoplasm of prostate: Secondary | ICD-10-CM

## 2018-10-09 DIAGNOSIS — Z8739 Personal history of other diseases of the musculoskeletal system and connective tissue: Secondary | ICD-10-CM | POA: Insufficient documentation

## 2018-10-09 LAB — CBC
HCT: 41.1 % (ref 39.0–52.0)
HEMOGLOBIN: 13.6 g/dL (ref 13.0–17.0)
MCHC: 33 g/dL (ref 30.0–36.0)
MCV: 92.3 fl (ref 78.0–100.0)
Platelets: 235 10*3/uL (ref 150.0–400.0)
RBC: 4.45 Mil/uL (ref 4.22–5.81)
RDW: 13.7 % (ref 11.5–15.5)
WBC: 9.4 10*3/uL (ref 4.0–10.5)

## 2018-10-09 LAB — COMPREHENSIVE METABOLIC PANEL
ALK PHOS: 39 U/L (ref 39–117)
ALT: 29 U/L (ref 0–53)
AST: 27 U/L (ref 0–37)
Albumin: 4.2 g/dL (ref 3.5–5.2)
BILIRUBIN TOTAL: 0.3 mg/dL (ref 0.2–1.2)
BUN: 20 mg/dL (ref 6–23)
CO2: 31 meq/L (ref 19–32)
Calcium: 9.4 mg/dL (ref 8.4–10.5)
Chloride: 104 mEq/L (ref 96–112)
Creatinine, Ser: 0.96 mg/dL (ref 0.40–1.50)
GFR: 81.86 mL/min (ref >60.00)
GLUCOSE: 94 mg/dL (ref 70–99)
Potassium: 3.8 mEq/L (ref 3.5–5.1)
SODIUM: 142 meq/L (ref 135–145)
TOTAL PROTEIN: 6.3 g/dL (ref 6.0–8.3)

## 2018-10-09 LAB — LIPID PANEL
CHOL/HDL RATIO: 3
Cholesterol: 177 mg/dL (ref 0–200)
HDL: 55.1 mg/dL (ref >39.00)
LDL Cholesterol: 94 mg/dL (ref 0–99)
NONHDL: 121.91
Triglycerides: 140 mg/dL (ref 0.0–149.0)
VLDL: 28 mg/dL (ref 0.0–40.0)

## 2018-10-09 LAB — PSA: PSA: 0.46 ng/mL (ref 0.10–4.00)

## 2018-10-09 NOTE — Patient Instructions (Addendum)
Health Maintenance Due  Topic Date Due  . COLONOSCOPY Pt stated that he cannot go through with this "chickened out" pt requesting another referral 07/10/2015   Please stop by lab before you go If you do not have mychart- we will call you about results within 5 business days of [place] receiving them.  If you have mychart- we will send your results within 3 business days of [place] receiving them.  If abnormal or we want to clarify a result, we will call or mychart you to make sure you receive the message.  If you have questions or concerns or don't hear within 5-7 days, please send [place] a message or call us.   EKG before you leave  Move your visit in April out to 6 months from now so we can recheck blood pressure- we are considering reducing to 2.5mg  amlodipine- if you start noticing lightheadedness/dizziness prior to visit please let me know

## 2018-10-09 NOTE — Addendum Note (Signed)
Addended by: Marin Olp on: 10/09/2018 11:07 AM   Modules accepted: Level of Service

## 2018-10-09 NOTE — Assessment & Plan Note (Signed)
PMR-presumptive diagnosis given clinical picture and improvement on prednisone -Doing well on prednisone and being titrated down at present -Continues to follow-up with rheumatology - doing calcium and vitamin D and also doing a lot of running to help prevent bone loss

## 2018-10-09 NOTE — Progress Notes (Signed)
Phone: [phone]   Subjective:  Patient presents today for their annual physical. Chief complaint-noted.   See problem oriented charting- ROS- full  review of systems was completed and negative except for: joint pain, toe pain (shooting pain every so often- had x-ray with rheumatologist) short term but cant walk 3-4 seconds. Has happened 3-4 times.   BMI monitoring- elevated BMI noted: Body mass index is 26.51 kg/m. Encouraged need for healthy eating, regular exercise, weight loss.   BMI Metric Follow Up - 10/09/18 0902      BMI Metric Follow Up-Please document annually   BMI Metric Follow Up  Education provided      The following were reviewed and entered/updated in epic: Past Medical History:  Diagnosis Date  . History of shingles    Patient Active Problem List   Diagnosis Date Noted  . Essential hypertension 09/30/2017    Priority: Medium  . Herpes zoster ophthalmicus 04/24/2015    Priority: Medium  . Hamstring strain, initial encounter 09/06/2017    Priority: Low  . PMR (polymyalgia rheumatica) (HCC) 10/09/2018  . Myalgia 02/05/2018  . Arthralgia 02/03/2018  . Sneezing 09/30/2017  . Erectile dysfunction 09/30/2017   Past Surgical History:  Procedure Laterality Date  . HAND SURGERY Right    2012-7 screws and a pin    Family History  Problem Relation Age of Onset  . Heart disease Father        CABG age 71, died 86. Smoker, far different lifestyle.   . Cancer Father        throat, smoker  . Hypertension Other        mother's side    Medications- reviewed and updated Current Outpatient Medications  Medication Sig Dispense Refill  . amLODipine (NORVASC) 5 MG tablet TAKE 1 TABLET BY MOUTH EVERY DAY 90 tablet 1  . predniSONE (DELTASONE) 1 MG tablet Patient taking 9 mg daily (8 month taper)    . predniSONE (DELTASONE) 5 MG tablet Patient taking 9 mg daily (8 month taper)    . sildenafil (VIAGRA) 100 MG tablet Take 1 tablet (100 mg total) by mouth daily as  needed for erectile dysfunction. 10 tablet 5   No current facility-administered medications for this visit.     Allergies-reviewed and updated No Known Allergies  Social History   Social History Narrative   Married. 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       Caffeine use: 3 cup in the morning , no soda      Hobbies: exercise, play golf, read   Objective  Objective:  BP 110/80 (BP Location: Right Arm, Patient Position: Sitting, Cuff Size: Large)   Pulse 61   Temp 98.1 F (36.7 C) (Oral)   Ht 5\' 11"  (1.803 m)   Wt 190 lb 1.6 oz (86.2 kg)   SpO2 96%   BMI 26.51 kg/m  Gen: NAD, resting comfortably HEENT: Mucous membranes are moist. Oropharynx normal Neck: no thyromegaly CV: RRR no murmurs rubs or gallops Lungs: CTAB no crackles, wheeze, rhonchi Abdomen: soft/nontender/nondistended/normal bowel sounds. No rebound or guarding.  Ext: no edema Skin: warm, dry Neuro: grossly normal, moves all extremities, PERRLA  EKG: sinus bradycardia with rate 53, normal axis, normal intervals, no hypertrophy, no st or t wave chages    Assessment and Plan  53 y.o. male presenting for annual physical.  Health Maintenance counseling: 1. Anticipatory guidance: Patient counseled regarding regular dental exams -q6 months, eye exams -  every other yearly,  avoiding smoking and second hand smoke , limiting alcohol to 2 beverages per day - 7-10.   2. Risk factor reduction:  Advised patient of need for regular exercise and diet rich and fruits and vegetables to reduce risk of heart attack and stroke. Exercise- 5 days a week. Diet-trying to eat well.  Weight stable from last year's physical despite having to use prednisone. Wt Readings from Last 3 Encounters:  10/09/18 190 lb 1.6 oz (86.2 kg)  06/21/18 187 lb 12.8 oz (85.2 kg)  06/06/18 187 lb (84.8 kg)  3. Immunizations/screenings/ancillary studies- discussed Shingrix today-defer given recent health issues with PMR and  current prednisone we opted to defer until next year Immunization History  Administered Date(s) Administered  . Influenza Split 06/09/2012  . Influenza,inj,Quad PF,6+ Mos 09/30/2017, 06/06/2018  . Td 08/30/2002  . Tdap 01/02/2015  4. Prostate cancer screening- we will trend PSA alone-rectal exam only if concerning PSA trend Lab Results  Component Value Date   PSA 0.62 10/27/2009   5. Colon cancer screening - referred for colonoscopy today, states "chickened out" last year 30. Skin cancer screening- every few years goes to dermatology. advised regular sunscreen use. Denies worrisome, changing, or new skin lesions.  7.  Never smoker  Status of chronic or acute concerns   Essential hypertension Hypertension-controlled on amlodipine 5 mg - discussed possibly reducing but we will leave alone due to high work stress plus long term prednisone -running seems to have helped- has been able to restart once being treated for PMR  PMR (polymyalgia rheumatica) ([place]) PMR-presumptive diagnosis given clinical picture and improvement on prednisone -Doing well on prednisone and being titrated down at present -Continues to follow-up with rheumatology - doing calcium and vitamin D and also doing a lot of running to help prevent bone loss   Future Appointments  Date Time Provider [place]  04/10/2019  8:40 AM Marin Olp, MD [facility]   Return in about 6 months (around 04/09/2019) for follow up- or sooner if needed.  Lab/Order associations: FASTING  Preventative health care - Plan: PSA, CBC, Comprehensive metabolic panel, Lipid panel, EKG 12-Lead  Screen for colon cancer - Plan: Ambulatory referral to Gastroenterology, CANCELED: Ambulatory referral to Gastroenterology  BMI 26.0-26.9,adult  Essential hypertension - Plan: CBC, Comprehensive metabolic panel, Lipid panel, EKG 12-Lead  Screening for prostate cancer - Plan: PSA  PMR (polymyalgia rheumatica) (HCC)  Return  precautions advised.  Garret Reddish, MD

## 2018-10-09 NOTE — Assessment & Plan Note (Signed)
Hypertension-controlled on amlodipine 5 mg - discussed possibly reducing but we will leave alone due to high work stress plus long term prednisone -running seems to have helped- has been able to restart once being treated for PMR

## 2018-10-10 ENCOUNTER — Telehealth: Payer: Self-pay | Admitting: Family Medicine

## 2018-10-10 NOTE — Telephone Encounter (Signed)
Copied from [place] [id number]. Topic: Quick Communication - Lab Results (Clinic Use ONLY) >> Oct 10, 2018  9:52 AM [place], [place], [place] wrote: Called patient to inform them of 10/09/2018 lab results. When patient returns call, triage nurse may disclose results. >> Oct 10, 2018 10:39 AM Bea Graff, NT wrote: Pt calling back to discuss lab results.

## 2018-10-10 NOTE — Telephone Encounter (Signed)
Copied from [place] [id number]. Topic: Quick Communication - Lab Results (Clinic Use ONLY) >> Oct 10, 2018  9:52 AM [place], [place], [place] wrote: Called patient to inform them of 10/09/2018 lab results. When patient returns call, triage nurse may disclose results. >> Oct 10, 2018 10:39 AM Bea Graff, NT wrote: Pt calling back to discuss lab results.

## 2018-10-25 ENCOUNTER — Other Ambulatory Visit: Payer: Self-pay

## 2018-10-25 MED ORDER — AMLODIPINE BESYLATE 5 MG PO TABS: 5.0000 mg | ORAL_TABLET | Freq: Every day | ORAL | 1 refills | Status: DC

## 2018-10-31 ENCOUNTER — Encounter: Payer: Self-pay | Admitting: Family Medicine

## 2018-11-08 ENCOUNTER — Encounter: Payer: Self-pay | Admitting: Family Medicine

## 2018-12-18 ENCOUNTER — Ambulatory Visit: Payer: 59 | Admitting: Family Medicine

## 2019-04-09 NOTE — Progress Notes (Signed)
Phone [phone]   Subjective:  Virtual visit via Video note. Chief complaint: Chief Complaint  Patient presents with  . Follow-up  . Hypertension  . Polymyalgia Rheumatica  . Erectile Dysfunction   This visit type was conducted due to national recommendations for restrictions regarding the COVID-19 Pandemic (e.g. social distancing).  This format is felt to be most appropriate for this patient at this time balancing risks to patient and risks to population by having him in for in person visit.  No physical exam was performed (except for noted visual exam or audio findings with Telehealth visits).   Our team/I connected with Stormy Fabian at  8:40 AM EDT by a video enabled telemedicine application (doxy.me or caregility through epic) and verified that I am speaking with the correct person using two identifiers.  Location patient: [place] Location provider: [facility], office Persons participating in the virtual visit:  patient  Our team/I discussed the limitations of evaluation and management by telemedicine and the availability of in person appointments. In light of current covid-19 pandemic, patient also understands that we are trying to protect them by minimizing in office contact if at all possible.  The patient expressed consent for telemedicine visit and agreed to proceed. Patient understands insurance will be billed.   ROS- Denies HA, dizziness, CP, SOB, visual changes. Occasionally he will feel light-headed when going from sit-to-stand.    Past Medical History-  Patient Active Problem List   Diagnosis Date Noted  . Essential hypertension 09/30/2017    Priority: Medium  . Herpes zoster ophthalmicus 04/24/2015    Priority: Medium  . Hamstring strain, initial encounter 09/06/2017    Priority: Low  . PMR (polymyalgia rheumatica) (HCC) 10/09/2018  . Myalgia 02/05/2018  . Arthralgia 02/03/2018  . Sneezing 09/30/2017  . Erectile dysfunction 09/30/2017   Medications-  reviewed and updated Current Outpatient Medications  Medication Sig Dispense Refill  . amLODipine (NORVASC) 5 MG tablet Take 1 tablet (5 mg total) by mouth daily. 90 tablet 3  . predniSONE (DELTASONE) 1 MG tablet Patient taking 4 mg daily (8 month taper)    . sildenafil (VIAGRA) 100 MG tablet Take 1 tablet (100 mg total) by mouth daily as needed for erectile dysfunction. 10 tablet 5   No current facility-administered medications for this visit.      Objective:  BP 128/70   Pulse 73   Ht 5\' 11"  (1.803 m)   Wt 182 lb (82.6 kg)   BMI 25.38 kg/m  self reported vitals Gen: NAD, resting comfortably Lungs: nonlabored, normal respiratory rate  Skin: appears dry, no obvious rash    Assessment and Plan   #hypertension/overweight S: controlled on Amlodipine 5 mg daily. Not checking BP regularly. When he was checking it, BP readings were inconsistent. He did check for [place] today and was well controlled- BP listed below is from dentist.   Doing a really good job with exercise.- has good muscle mass. Trying to eat a healthy low salt diet.  BP Readings from Last 3 Encounters:  04/10/19 128/70- reported from dentist  10/09/18 110/80  06/21/18 (!) 138/92  A/P: hypertension controlled- continue current medicine. Overweight-  BMI overestimates his risk. I think hes at a reasonably healthy weight- he would like to drop 5-10 lbs.   # Polymyalgia Rheumatica S:Taking Prednisone 4 mg daily through rheumatology.  Doing well with this- age appropriate muscle aches.  A/P: doing well- continue slow taper. Prednisone likely makes it more difficult for him to lose the 5-10  lbs he wants to lose   # Erectile Dysfunction S:Taking Sildenafil 100 mg prn.  Has not used in over a year but likes to have on hand A/P: glad this has been doing well- refilled medicine in case he needs it  # Sneezing S: Does have loud sneeze 2-4 times a day. Not worsening pattern. Does not consistently do flonase. Has tried  antihistamine allegra D in past but raised BP.  A/P:  He may try flonase for a month to see if this is an irritant/allergies- does not mean he has to take this long term. I do not think anything dangerous at play- likely just his baseline. No worsening pattern thankfully. No cough.   Recommended follow up: 6 month physical- he plans on January as has [employer]  Lab/Order associations:   ICD-10-CM   1. Essential hypertension  I10   2. PMR (polymyalgia rheumatica) (HCC)  M35.3   3. Erectile dysfunction, unspecified erectile dysfunction type  N52.9   4. Overweight  E66.3   5. Sneezing  R06.7   6. Screen for colon cancer  Z12.11 Ambulatory referral to Gastroenterology   Meds ordered this encounter  Medications  . amLODipine (NORVASC) 5 MG tablet    Sig: Take 1 tablet (5 mg total) by mouth daily.    Dispense:  90 tablet    Refill:  3  . sildenafil (VIAGRA) 100 MG tablet    Sig: Take 1 tablet (100 mg total) by mouth daily as needed for erectile dysfunction.    Dispense:  10 tablet    Refill:  5   Return precautions advised.  Garret Reddish, MD

## 2019-04-09 NOTE — Patient Instructions (Addendum)
Health Maintenance Due  Topic Date Due  . COLONOSCOPY - referring today 07/10/2015  . INFLUENZA VACCINE - advised in 1 month 03/31/2019

## 2019-04-10 ENCOUNTER — Encounter: Payer: Self-pay | Admitting: Gastroenterology

## 2019-04-10 ENCOUNTER — Encounter: Payer: Self-pay | Admitting: Family Medicine

## 2019-04-10 ENCOUNTER — Ambulatory Visit (INDEPENDENT_AMBULATORY_CARE_PROVIDER_SITE_OTHER): Payer: 59 | Admitting: Family Medicine

## 2019-04-10 VITALS — BP 128/70 | HR 73 | Ht 71.0 in | Wt 182.0 lb

## 2019-04-10 DIAGNOSIS — E663 Overweight: Secondary | ICD-10-CM

## 2019-04-10 DIAGNOSIS — N529 Male erectile dysfunction, unspecified: Secondary | ICD-10-CM | POA: Diagnosis not present

## 2019-04-10 DIAGNOSIS — I10 Essential (primary) hypertension: Secondary | ICD-10-CM | POA: Diagnosis not present

## 2019-04-10 DIAGNOSIS — R067 Sneezing: Secondary | ICD-10-CM

## 2019-04-10 DIAGNOSIS — Z1211 Encounter for screening for malignant neoplasm of colon: Secondary | ICD-10-CM

## 2019-04-10 DIAGNOSIS — M353 Polymyalgia rheumatica: Secondary | ICD-10-CM

## 2019-04-10 MED ORDER — SILDENAFIL CITRATE 100 MG PO TABS: 100.0000 mg | ORAL_TABLET | Freq: Every day | ORAL | 5 refills | Status: DC | PRN

## 2019-04-10 MED ORDER — AMLODIPINE BESYLATE 5 MG PO TABS: 5.0000 mg | ORAL_TABLET | Freq: Every day | ORAL | 3 refills | Status: DC

## 2019-05-01 ENCOUNTER — Other Ambulatory Visit: Payer: Self-pay

## 2019-05-01 ENCOUNTER — Ambulatory Visit (AMBULATORY_SURGERY_CENTER): Payer: Self-pay | Admitting: *Deleted

## 2019-05-01 VITALS — Temp 97.3°F | Ht 71.0 in | Wt 189.0 lb

## 2019-05-01 DIAGNOSIS — Z1211 Encounter for screening for malignant neoplasm of colon: Secondary | ICD-10-CM

## 2019-05-01 MED ORDER — SUPREP BOWEL PREP KIT 17.5-3.13-1.6 GM/177ML PO SOLN: 1.0000 | Freq: Once | ORAL | 0 refills | Status: AC

## 2019-05-01 NOTE — Progress Notes (Signed)
No egg or soy allergy known to patient  No issues with past sedation with any surgeries  or procedures, no intubation problems  No diet pills per patient No home 02 use per patient  No blood thinners per patient  Pt states  issues with constipation due to prednisone - not going daily - stools can be hard - asked about probiotic daily- instructed pt okay to use daily probiotic OTC-  Pt denies this to be aq chronic issue at this time No A fib or A flutter  EMMI video sent to pt's e mail  Suprep $15 coupon to pt in [place] today

## 2019-05-02 ENCOUNTER — Encounter: Payer: Self-pay | Admitting: Gastroenterology

## 2019-05-14 ENCOUNTER — Encounter: Payer: 59 | Admitting: Gastroenterology

## 2019-05-31 ENCOUNTER — Other Ambulatory Visit: Payer: Self-pay

## 2019-05-31 ENCOUNTER — Encounter: Payer: Self-pay | Admitting: Gastroenterology

## 2019-05-31 ENCOUNTER — Encounter: Payer: 59 | Admitting: Gastroenterology

## 2019-05-31 ENCOUNTER — Ambulatory Visit (AMBULATORY_SURGERY_CENTER): Payer: 59 | Admitting: Gastroenterology

## 2019-05-31 VITALS — BP 127/81 | HR 53 | Temp 97.8°F | Resp 14 | Ht 71.0 in | Wt 189.0 lb

## 2019-05-31 DIAGNOSIS — Z1211 Encounter for screening for malignant neoplasm of colon: Secondary | ICD-10-CM

## 2019-05-31 DIAGNOSIS — D120 Benign neoplasm of cecum: Secondary | ICD-10-CM

## 2019-05-31 MED ORDER — SODIUM CHLORIDE 0.9 % IV SOLN: 500.0000 mL | Freq: Once | INTRAVENOUS | Status: DC

## 2019-05-31 NOTE — Op Note (Signed)
[facility] Patient Name: Daniel Blair Procedure Date: 05/31/2019 10:50 AM MRN: [id number] Endoscopist: Mallie Mussel L. Loletha Carrow , MD Age: 53 Referring MD:  Date of Birth: Jun 03, 1966 Gender: Male Account #: [id number] Procedure:                Colonoscopy Indications:              Screening for colorectal malignant neoplasm, This                            is the patient's first colonoscopy Medicines:                Monitored Anesthesia Care Procedure:                Pre-Anesthesia Assessment:                           - Prior to the procedure, a History and Physical                            was performed, and patient medications and                            allergies were reviewed. The patient's tolerance of                            previous anesthesia was also reviewed. The risks                            and benefits of the procedure and the sedation                            options and risks were discussed with the patient.                            All questions were answered, and informed consent                            was obtained. Prior Anticoagulants: The patient has                            taken no previous anticoagulant or antiplatelet                            agents. ASA Grade Assessment: II - A patient with                            mild systemic disease. After reviewing the risks                            and benefits, the patient was deemed in                            satisfactory condition to undergo the procedure.  After obtaining informed consent, the colonoscope                            was passed under direct vision. Throughout the                            procedure, the patient's blood pressure, pulse, and                            oxygen saturations were monitored continuously. The                            Colonoscope was introduced through the anus and                            advanced to the the cecum,  identified by                            appendiceal orifice and ileocecal valve. The                            colonoscopy was performed without difficulty. The                            patient tolerated the procedure well. The quality                            of the bowel preparation was good. The ileocecal                            valve, appendiceal orifice, and rectum were                            photographed. Scope In: 11:04:52 AM Scope Out: 11:22:53 AM Scope Withdrawal Time: 0 hours 12 minutes 45 seconds  Total Procedure Duration: 0 hours 18 minutes 1 second  Findings:                 The perianal and digital rectal examinations were                            normal.                           Multiple diverticula were found in the left colon.                           A 5 mm polyp was found in the cecum. The polyp was                            sessile. The polyp was removed with a cold snare.                            Resection and retrieval were complete.  The exam was otherwise without abnormality on                            direct and retroflexion views. Complications:            No immediate complications. Estimated Blood Loss:     Estimated blood loss was minimal. Impression:               - Diverticulosis in the left colon.                           - One 5 mm polyp in the cecum, removed with a cold                            snare. Resected and retrieved.                           - The examination was otherwise normal on direct                            and retroflexion views. Recommendation:           - Patient has a contact number available for                            emergencies. The signs and symptoms of potential                            delayed complications were discussed with the                            patient. Return to normal activities tomorrow.                            Written discharge instructions were  provided to the                            patient.                           - Resume previous diet.                           - Continue present medications.                           - Await pathology results.                           - Repeat colonoscopy is recommended for                            surveillance. The colonoscopy date will be                            determined after pathology results from today's  exam become available for review. Trase Bunda L. Loletha Carrow, MD 05/31/2019 11:27:52 AM This report has been signed electronically.

## 2019-05-31 NOTE — Progress Notes (Signed)
Pt's states no medical or surgical changes since previsit or office visit.  Temp taken by JB VS taken by CW 

## 2019-05-31 NOTE — Patient Instructions (Signed)
Please read handouts provided. Continue present medications. Await pathology results.        YOU HAD AN ENDOSCOPIC PROCEDURE TODAY AT THE [facility]:   Refer to the procedure report that was given to you for any specific questions about what was found during the examination.  If the procedure report does not answer your questions, please call your gastroenterologist to clarify.  If you requested that your care partner not be given the details of your procedure findings, then the procedure report has been included in a sealed envelope for you to review at your convenience later.  YOU SHOULD EXPECT: Some feelings of bloating in the abdomen. Passage of more gas than usual.  Walking can help get rid of the air that was put into your GI tract during the procedure and reduce the bloating. If you had a lower endoscopy (such as a colonoscopy or flexible sigmoidoscopy) you may notice spotting of blood in your stool or on the toilet paper. If you underwent a bowel prep for your procedure, you may not have a normal bowel movement for a few days.  Please Note:  You might notice some irritation and congestion in your nose or some drainage.  This is from the oxygen used during your procedure.  There is no need for concern and it should clear up in a day or so.  SYMPTOMS TO REPORT IMMEDIATELY:   Following lower endoscopy (colonoscopy or flexible sigmoidoscopy):  Excessive amounts of blood in the stool  Significant tenderness or worsening of abdominal pains  Swelling of the abdomen that is new, acute  Fever of 100F or higher    For urgent or emergent issues, a gastroenterologist can be reached at any hour by calling [phone].   DIET:  We do recommend a small meal at first, but then you may proceed to your regular diet.  Drink plenty of fluids but you should avoid alcoholic beverages for 24 hours.  ACTIVITY:  You should plan to take it easy for the rest of today and you should NOT  DRIVE or use heavy machinery until tomorrow (because of the sedation medicines used during the test).    FOLLOW UP: Our staff will call the number listed on your records 48-72 hours following your procedure to check on you and address any questions or concerns that you may have regarding the information given to you following your procedure. If we do not reach you, we will leave a message.  We will attempt to reach you two times.  During this call, we will ask if you have developed any symptoms of COVID 19. If you develop any symptoms (ie: fever, flu-like symptoms, shortness of breath, cough etc.) before then, please call [phone].  If you test positive for Covid 19 in the 2 weeks post procedure, please call and report this information to us.    If any biopsies were taken you will be contacted by phone or by letter within the next 1-3 weeks.  Please call us at [phone] if you have not heard about the biopsies in 3 weeks.    SIGNATURES/CONFIDENTIALITY: You and/or your care partner have signed paperwork which will be entered into your electronic medical record.  These signatures attest to the fact that that the information above on your After Visit Summary has been reviewed and is understood.  Full responsibility of the confidentiality of this discharge information lies with you and/or your care-partner. 

## 2019-05-31 NOTE — Progress Notes (Signed)
Called to room to assist during endoscopic procedure.  Patient ID and intended procedure confirmed with present staff. Received instructions for my participation in the procedure from the performing physician.  

## 2019-05-31 NOTE — Progress Notes (Signed)
A and O x3. Report to RN. Tolerated MAC anesthesia well.

## 2019-06-04 ENCOUNTER — Encounter: Payer: Self-pay | Admitting: Gastroenterology

## 2019-06-04 ENCOUNTER — Telehealth: Payer: Self-pay | Admitting: *Deleted

## 2019-06-04 ENCOUNTER — Telehealth: Payer: Self-pay

## 2019-06-04 NOTE — Telephone Encounter (Signed)
  Follow up Call-  Call back number 05/31/2019  Post procedure Call Back phone  # [phone]  Permission to leave phone message Yes  Some recent data might be hidden     Patient questions:  Do you have a fever, pain , or abdominal swelling? No. Pain Score  0 *  Have you tolerated food without any problems? Yes.    Have you been able to return to your normal activities? Yes.    Do you have any questions about your discharge instructions: Diet   No. Medications  No. Follow up visit  No.  Do you have questions or concerns about your Care? No.  Actions: * If pain score is 4 or above: No action needed, pain <4.  1. Have you developed a fever since your procedure? No   2.   Have you had an respiratory symptoms (SOB or cough) since your procedure? no  3.   Have you tested positive for COVID 19 since your procedure no  4.   Have you had any family members/close contacts diagnosed with the COVID 19 since your procedure?  no   If yes to any of these questions please route to Joylene John, RN and Alphonsa Gin, Therapist, sports.

## 2019-06-04 NOTE — Telephone Encounter (Signed)
Attempted to reach patient for post-procedure f/u call. No answer. Left message that we will make another attempt to reach him again later today and for him to please not hesitate to call us if he has any questions/concerns regarding his care. 

## 2019-06-14 ENCOUNTER — Telehealth: Payer: Self-pay | Admitting: *Deleted

## 2019-06-14 NOTE — Telephone Encounter (Signed)
Dr. Loletha Carrow,   Thank you for taking good care of me recently.   I want to make sure I understand your letter accurately concerning my adenomatous polyp. The letter says to repeat the colonoscopy in 7 years, but my recollection is you said 3 years for pre-cancerous finding and 5 years for a non-pre-cancerous finding. So, do I come back in 3, 5, or 7 years?    Sorry to bother you, but I want to make sure I come back at the right time.   Daniel Blair   ------ The above was copied and pasted in a telephone note to be sent to you. I replied to this patient's message and then realized I could not forward it to you afterwards. Please advise?

## 2019-06-19 ENCOUNTER — Encounter: Payer: Self-pay | Admitting: *Deleted

## 2019-06-19 NOTE — Telephone Encounter (Signed)
MyChart message sent to patient with Dr. Loletha Carrow' explanation copied and pasted.

## 2019-06-19 NOTE — Telephone Encounter (Signed)
Apologies for any confusion.  After the procedure, I was most likely giving examples of possible recall intervals for polyps in general.  But it depends on number type and size of polyps.  He had a complete exam, good prep, single 6 mm tubular adenoma  - in current surveillance guidelines that is 7 year recall.    Let me know if any more questions.  Thanks  - HD

## 2019-11-10 ENCOUNTER — Encounter: Payer: Self-pay | Admitting: Family Medicine

## 2020-01-11 ENCOUNTER — Ambulatory Visit (INDEPENDENT_AMBULATORY_CARE_PROVIDER_SITE_OTHER): Payer: 59 | Admitting: Family Medicine

## 2020-01-11 ENCOUNTER — Other Ambulatory Visit: Payer: Self-pay

## 2020-01-11 ENCOUNTER — Encounter: Payer: Self-pay | Admitting: Family Medicine

## 2020-01-11 VITALS — BP 136/90 | HR 50 | Temp 98.0°F | Ht 71.5 in | Wt 185.0 lb

## 2020-01-11 DIAGNOSIS — I10 Essential (primary) hypertension: Secondary | ICD-10-CM | POA: Diagnosis not present

## 2020-01-11 DIAGNOSIS — Z0000 Encounter for general adult medical examination without abnormal findings: Secondary | ICD-10-CM

## 2020-01-11 DIAGNOSIS — Z8739 Personal history of other diseases of the musculoskeletal system and connective tissue: Secondary | ICD-10-CM | POA: Diagnosis not present

## 2020-01-11 DIAGNOSIS — Z125 Encounter for screening for malignant neoplasm of prostate: Secondary | ICD-10-CM | POA: Diagnosis not present

## 2020-01-11 DIAGNOSIS — M2569 Stiffness of other specified joint, not elsewhere classified: Secondary | ICD-10-CM | POA: Diagnosis not present

## 2020-01-11 LAB — COMPREHENSIVE METABOLIC PANEL
ALT: 17 U/L (ref 0–53)
AST: 16 U/L (ref 0–37)
Albumin: 4.3 g/dL (ref 3.5–5.2)
Alkaline Phosphatase: 41 U/L (ref 39–117)
BUN: 19 mg/dL (ref 6–23)
CO2: 29 mEq/L (ref 19–32)
Calcium: 8.9 mg/dL (ref 8.4–10.5)
Chloride: 105 mEq/L (ref 96–112)
Creatinine, Ser: 0.87 mg/dL (ref 0.40–1.50)
GFR: 91.27 mL/min (ref >60.00)
Glucose, Bld: 98 mg/dL (ref 70–99)
Potassium: 4.5 mEq/L (ref 3.5–5.1)
Sodium: 138 mEq/L (ref 135–145)
Total Bilirubin: 0.4 mg/dL (ref 0.2–1.2)
Total Protein: 6.3 g/dL (ref 6.0–8.3)

## 2020-01-11 LAB — CBC WITH DIFFERENTIAL/PLATELET
Basophils Absolute: 0 10*3/uL (ref 0.0–0.1)
Basophils Relative: 0.7 % (ref 0.0–3.0)
Eosinophils Absolute: 0.1 10*3/uL (ref 0.0–0.7)
Eosinophils Relative: 1.2 % (ref 0.0–5.0)
HCT: 38.9 % — ABNORMAL LOW (ref 39.0–52.0)
Hemoglobin: 13 g/dL (ref 13.0–17.0)
Lymphocytes Relative: 34.6 % (ref 12.0–46.0)
Lymphs Abs: 2.2 10*3/uL (ref 0.7–4.0)
MCHC: 33.3 g/dL (ref 30.0–36.0)
MCV: 92.3 fl (ref 78.0–100.0)
Monocytes Absolute: 0.6 10*3/uL (ref 0.1–1.0)
Monocytes Relative: 10.1 % (ref 3.0–12.0)
Neutro Abs: 3.4 10*3/uL (ref 1.4–7.7)
Neutrophils Relative %: 53.4 % (ref 43.0–77.0)
Platelets: 225 10*3/uL (ref 150.0–400.0)
RBC: 4.22 Mil/uL (ref 4.22–5.81)
RDW: 13.2 % (ref 11.5–15.5)
WBC: 6.3 10*3/uL (ref 4.0–10.5)

## 2020-01-11 LAB — PSA: PSA: 0.37 ng/mL (ref 0.10–4.00)

## 2020-01-11 LAB — LIPID PANEL
Cholesterol: 163 mg/dL (ref 0–200)
HDL: 49.9 mg/dL (ref >39.00)
LDL Cholesterol: 87 mg/dL (ref 0–99)
NonHDL: 113.18
Total CHOL/HDL Ratio: 3
Triglycerides: 129 mg/dL (ref 0.0–149.0)
VLDL: 25.8 mg/dL (ref 0.0–40.0)

## 2020-01-11 NOTE — Patient Instructions (Addendum)
Call to schedule Shingrix #1 when you find a good day on your schedule. Repeat injection in 2-5 months. Also Schedule a nurse visit for the 2nd injection when you call in.  - could also schedule 2nd one at 6 month visit with me  With work related stress consider calling [phone] to schedule a visit with [facility] -Trey Paula is an excellent counselor who is based out of our clinic - I may reach out about other options- Bambi Cottle if available is another good option  - [email or web address] another option but I do not think he takes your insurance type   - another option -Vivia Budge [email or web address]  Please stop by lab before you go If you have mychart- we will send your results within 3 business days of [place] receiving them.  If you do not have mychart- we will call you about results within 5 business days of [place] receiving them.

## 2020-01-11 NOTE — Progress Notes (Signed)
Phone: [phone]    I,Donna Orphanos,acting as a scribe for Garret Reddish, MD.,have documented all relevant documentation on the behalf of Garret Reddish, MD,as directed by  Garret Reddish, MD while in the presence of Garret Reddish, MD.  Subjective:  Patient presents today for their annual physical. Chief complaint-noted.   See problem oriented charting- ROS- full  review of systems was completed and negative  except for: some low back stiffness   The following were reviewed and entered/updated in epic: Past Medical History:  Diagnosis Date  . History of shingles   . Hypertension   . Polymyalgia rheumatica (HCC)    on prednisone   Patient Active Problem List   Diagnosis Date Noted  . History of polymyalgia rheumatica 10/09/2018    Priority: High  . Essential hypertension 09/30/2017    Priority: Medium  . Herpes zoster ophthalmicus 04/24/2015    Priority: Medium  . Hamstring strain, initial encounter 09/06/2017    Priority: Low  . Myalgia 02/05/2018  . Arthralgia 02/03/2018  . Sneezing 09/30/2017  . Erectile dysfunction 09/30/2017   Past Surgical History:  Procedure Laterality Date  . HAND SURGERY Right    2012-7 screws and a pin  . ingrown toenail     as a child with sedation  . WISDOM TOOTH EXTRACTION      Family History  Problem Relation Age of Onset  . Heart disease Father        CABG age 67, died 69. Smoker, far different lifestyle.   . Cancer Father        throat, smoker  . Esophageal cancer Father   . Hypertension Other        mother's side  . Colon cancer Neg Hx   . Colon polyps Neg Hx   . Stomach cancer Neg Hx   . Rectal cancer Neg Hx     Medications- reviewed and updated Current Outpatient Medications  Medication Sig Dispense Refill  . amLODipine (NORVASC) 5 MG tablet Take 1 tablet (5 mg total) by mouth daily. 90 tablet 3  . Ascorbic Acid (VITAMIN C) 1000 MG tablet Take 1,000 mg by mouth daily.    Marland Kitchen olopatadine (PATANOL) 0.1 % ophthalmic  solution 1 drop as needed.     Marland Kitchen OVER THE COUNTER MEDICATION daily. Juice Plus daily    . sildenafil (VIAGRA) 100 MG tablet Take 1 tablet (100 mg total) by mouth daily as needed for erectile dysfunction. 10 tablet 5  . fluorouracil (EFUDEX) 5 % cream      No current facility-administered medications for this visit.    Allergies-reviewed and updated No Known Allergies  Social History   Social History Narrative   Married. 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       Caffeine use: 3 cup in the morning , no soda      Hobbies: exercise, play golf, read   Objective  Objective:  BP 136/90 (BP Location: Left Arm, Patient Position: Sitting, Cuff Size: Normal)   Pulse (!) 50   Temp 98 F (36.7 C) (Oral)   Ht 5' 11.5" (1.816 m)   Wt 185 lb (83.9 kg)   SpO2 97%   BMI 25.44 kg/m  Gen: NAD, resting comfortably HEENT: Mucous membranes are moist. Oropharynx normal Neck: no thyromegaly CV: RRR no murmurs rubs or gallops Lungs: CTAB no crackles, wheeze, rhonchi Abdomen: soft/nontender/nondistended/normal bowel sounds. No rebound or guarding.  Ext: no edema Skin: warm, dry Neuro:  grossly normal, moves all extremities, PERRLA   Assessment and Plan  54 y.o. male presenting for annual physical.  Health Maintenance counseling: 1. Anticipatory guidance: Patient counseled regarding regular dental exams q6 months, eye exams yearly,  avoiding smoking and second hand smoke , limiting alcohol to 1 beverages per day. 2. Risk factor reduction:  Advised patient of need for regular exercise and diet rich and fruits and vegetables to reduce risk of heart attack and stroke. Exercise- 6x's a week runs or walks- goes to [employer]. Diet-Healthy. Long term home scale goal 175 Wt Readings from Last 3 Encounters:  01/11/20 185 lb (83.9 kg)  05/31/19 189 lb (85.7 kg)  05/01/19 189 lb (85.7 kg)  3. Immunizations/screenings/ancillary studies- consider shingrix in future- he will call  back to schedule Immunization History  Administered Date(s) Administered  . Influenza Split 06/09/2012  . Influenza,inj,Quad PF,6+ Mos 09/30/2017, 06/06/2018  . Moderna SARS-COVID-2 Vaccination 10/31/2019, 11/28/2019  . Td 08/30/2002  . Tdap 01/02/2015   4. Prostate cancer screening- update psa with labs. Defer rectal unless psa trend concerning.   Lab Results  Component Value Date   PSA 0.46 10/09/2018   PSA 0.62 10/27/2009   5. Colon cancer screening - up to date, due 05/2026. Adenoma in 2020.  6. Skin cancer screening- dermatology this week. advised regular sunscreen use. Denies worrisome, changing, or new skin lesions.  7. never smoker  Status of chronic or acute concerns   # history PMR- has been off steroids for last 5 months. Some age appropriate aches and pains but nothing like what he had before. May return to Dr. Amil Amen for lingering back pain/stiffness issues.  - 4-5 months of low back stiffness. We will get Dr. Thompson Caul opinion of sports med - turmeric may upset stomach vs vitamin c vs mV- he is going to experiment to see which one is causing issue  #hypertension S: medication: amlodipine 5 mg daily, misses doses sometimes 2 days out of the week. Home readings #s: Has not been checking blood pressure at home. BP Readings from Last 3 Encounters:  01/11/20 136/90  05/31/19 127/81  04/10/19 128/70  A/P: slight poor control diastolic- he is going to monitor at home and encouraged regular compliance- could use phone alarm  Erectile Dysfunction- has not needed sildenafil recently but we will keep on the list  Saw allergist for really loud sneezes- allergist tested him and has some sensitivities. 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  - looking at new employment with covid related recession   Depression screen [facility] 2/9 04/10/2019 10/09/2018 09/30/2017  Decreased Interest 0 0 0  Down,  Depressed, Hopeless 0 0 0  PHQ - 2 Score 0 0 0    Recommended follow up: Return in about 6 months (around 07/13/2020) for follow up- or sooner if needed.   Lab/Order associations:not fasting   ICD-10-CM   1. Preventative health care  Z00.00 CBC with Differential/Platelet    Comprehensive metabolic panel    Lipid panel    PSA  2. Essential hypertension  I10 CBC with Differential/Platelet    Comprehensive metabolic panel    Lipid panel  3. Back stiffness  M25.69 Ambulatory referral to Sports Medicine  4. History of polymyalgia rheumatica  Z87.39 Ambulatory referral to Sports Medicine  5. Screening for prostate cancer  Z12.5 PSA   The entirety of the information documented in the History of Present Illness, Review of Systems  and Physical Exam were personally obtained by me. Portions of this information were initially documented by the CMA and reviewed by me for thoroughness and accuracy.   Garret Reddish, MD  Return precautions advised.  Garret Reddish, MD

## 2020-01-15 ENCOUNTER — Telehealth: Payer: Self-pay | Admitting: Family Medicine

## 2020-01-15 NOTE — Telephone Encounter (Signed)
Patient calling to see if some one could explain his lab work. He knows that it is in my chart, but would like a nurse to explain. Please advise. jk

## 2020-01-16 NOTE — Telephone Encounter (Signed)
Returned pt call and reviewed labs with pt. 

## 2020-03-06 ENCOUNTER — Other Ambulatory Visit: Payer: Self-pay

## 2020-03-06 ENCOUNTER — Ambulatory Visit: Payer: 59 | Admitting: Sports Medicine

## 2020-03-06 ENCOUNTER — Encounter: Payer: Self-pay | Admitting: Sports Medicine

## 2020-03-06 VITALS — BP 110/82 | Ht 71.0 in | Wt 182.0 lb

## 2020-03-06 DIAGNOSIS — G5761 Lesion of plantar nerve, right lower limb: Secondary | ICD-10-CM | POA: Diagnosis not present

## 2020-03-06 NOTE — Assessment & Plan Note (Signed)
Most likely cause of patient's pain, occurred with collapse of transverse arch, aggravated by ambulating barefooted.  - Recommend conservative measures with supportive inserts - Encourage wearing shoes with activity  - Can escalate treatment to custom orthotics, steroid injections, or surgery in the future should conservative measures fail

## 2020-03-06 NOTE — Patient Instructions (Addendum)
Thank you for coming in to see [place] today! Please see below to review our plan for today's visit:  1. Wear the green insert in your right shoe to support the arches of your feet and prevent compression of the nerve triggering pain in your toe. 2. If the neuroma continues to cause you lots of pain we can consider custom orthotics, steroid injections, and possibly even surgery in the future if the conservative measures fail.   Please call the clinic at [phone] if your symptoms worsen or you have any concerns. It was our pleasure to serve you!   Dr. Everlean Alstrom Dr. Milus Banister [facility]   Daniel Blair Neuralgia  Daniel Blair neuralgia is foot pain that affects the ball of the foot and the area near the toes. Morton neuralgia occurs when part of a nerve in the foot (digital nerve) is under too much pressure (compressed). When this happens over a long period of time, the nerve can thicken (neuroma) and cause pain. Pain usually occurs between the third and fourth toes.  Morton neuralgia can come and go but may get worse over time. What are the causes? This condition is caused by doing the same things over and over with your foot, such as:  Activities such as running or jumping.  Wearing shoes that are too tight. What increases the risk? You may be at higher risk for Morton neuralgia if you:  Are male.  Wear high heels.  Wear shoes that are narrow or tight.  Do activities that repeatedly stretch your toes, such as: ? Running. ? [place]. ? Long-distance walking. What are the signs or symptoms? The first symptom of Morton neuralgia is pain that spreads from the ball of the foot to the toes. It may feel like you are walking on a marble. Pain usually gets worse with walking and goes away at night. Other symptoms may include numbness and cramping of your toes. Both feet are equally affected, but rarely at the same time. How is this diagnosed? This condition is diagnosed  based on your symptoms, your medical history, and a physical exam. Your health care provider may:  Squeeze your foot just behind your toe.  Ask you to move your toes to check for pain.  Ask about your physical activity level. You also may have imaging tests, such as an X-ray, ultrasound, or MRI. How is this treated? Treatment depends on how severe your condition is and what causes it. Treatment may involve:  Wearing different shoes that are not too tight, are low-heeled, and provide good support. For some people, this is the only treatment needed.  Wearing an over-the-counter or custom supportive pad (orthotic) under the front of your foot.  Getting injections of numbing medicine and anti-inflammatory medicine (steroid) in the nerve.  Having surgery to remove part of the thickened nerve. Follow these instructions at home: Managing pain, stiffness, and swelling   Massage your foot as needed.  Wear orthotics as told by your health care provider.  If directed, put ice on your foot: ? Put ice in a plastic bag. ? Place a towel between your skin and the bag. ? Leave the ice on for 20 minutes, 2-3 times a day.  Avoid activities that cause pain or make pain worse. If you play sports, ask your health care provider when it is safe for you to return to sports.  Raise (elevate) your foot above the level of your heart while lying down and, when possible, while  sitting. General instructions  Take over-the-counter and prescription medicines only as told by your health care provider.  Do not drive or use heavy machinery while taking prescription pain medicine.  Wear shoes that: ? Have soft soles. ? Have a wide toe area. ? Provide arch support. ? Do not pinch or squeeze your feet. ? Have room for your orthotics, if applicable.  Keep all follow-up visits as told by your health care provider. This is important. Contact a health care provider if:  Your symptoms get worse or do not get  better with treatment and home care. Summary  Morton neuralgia is foot pain that affects the ball of the foot and the area near the toes. Pain usually occurs between the third and fourth toes, gets worse with walking, and goes away at night.  Morton neuralgia occurs when part of a nerve in the foot (digital nerve) is under too much pressure. When this happens over a long period of time, the nerve can thicken (neuroma) and cause pain.  This condition is caused by doing the same things over and over with your foot, such as running or jumping, wearing shoes that are too tight, or wearing high heels.  Treatment may involve wearing low-heeled shoes that are not too tight, wearing a supportive pad (orthotic) under the front of your foot, getting injections in the nerve, or having surgery to remove part of the thickened nerve.

## 2020-03-06 NOTE — Progress Notes (Signed)
  Daniel Blair - 54 y.o. male MRN [id number]  Date of birth: [birth year]    SUBJECTIVE:     Chief Complaint:/ HPI:  Pain in 4th toe of R foot: this is a pleasant patient reporting to clinic for the first time with concerns for pain in the 4th digit of the R foot that have been occurring on and off for about 1 year. He is unsure what caused it to start, denies any history of injury or trauma. He reports the pain feels "sharp, shooting" and only occurs when he is barefooted, located to lateral and dorsal aspect of 4th digit. He does not believe the pain spreads to his 5th digit. He is unable to reproduce the pain while ambulating barefoot in clinic today. The pain will occur suddenly, causing him to have to stop walking. The pain self resolves after a few minutes of rest, but can sometimes occur again soon after ambulating. The pain is not more significant at any particular time of day or with any particular activity except for ambulating barefooted, although the pain does not always occur while ambulating barefooted.  He does have a history of Polymyalgia rheumatic for which he was treated for 1 year with steroids. He reports the pain in the toe does not at all mimic the pains from PMR.    ROS:     See HPI  PERTINENT  PMH / PSH / FH / SH:  Past Medical, Surgical, Social, and Family History Reviewed & Updated in the EMR.  Pertinent findings include:  Patient Active Problem List   Diagnosis Date Noted  . Morton's neuralgia, right 03/06/2020  . History of polymyalgia rheumatica 10/09/2018  . Myalgia 02/05/2018  . Arthralgia 02/03/2018  . Erectile dysfunction 09/30/2017  . Essential hypertension 09/30/2017  . Herpes zoster ophthalmicus 04/24/2015    OBJECTIVE: BP 110/82   Ht 5\' 11"  (1.803 m)   Wt 182 lb (82.6 kg)   BMI 25.38 kg/m   Physical Exam:  Vital signs are reviewed.  GEN: Alert and oriented, NAD Pulm: Breathing unlabored PSY: normal mood, congruent affect  MSK: Right foot:  no swelling, erythema or ecchymosis upon inspection, however claw toe deformity is appreciated, splaying between 3rd and 4th digits apparent with slightly decreased longitudinal arch, no calluses or skin lesions appreciated; no TTP to foot or toe; ROM is full in toes, midfoot and ankles bilaterally; brisk capillary refill; normal gait appreciated  Ultrasound: Limited US of 4th toe. DIP and PIP show mild degenerative changes, Fluid/swelling of interdigital nerve between distal aspects of 4th and 5th metatarsals consistent with possible Morton's Neuroma.   ASSESSMENT & PLAN:  1. Morton's Neuralgia: most likely cause of patient's pain, occurred with collapse of transverse arch, aggravated by ambulating barefooted.  - Recommend conservative measures with supportive inserts - Encourage wearing shoes with activity  - Can escalate treatment to custom orthotics, steroid injections, or surgery in the future should conservative measures fail  Milus Banister, [place], PGY-3 03/06/2020 9:13 AM   Patient seen and evaluated with the resident.  I agree with the above plan of care.  Although this is not a classic presentation of a Morton's neuroma, his ultrasound findings support that.  Treatment as above.  His symptoms are really only present when walking barefoot so I do not think we need to get overly aggressive with treatment.  We will try simple metatarsal pad in his tennis shoes.  Follow-up as needed.

## 2020-04-25 IMAGING — MR MR PELVIS W/O CM
4 of 5 series · 29 of 48 positions shown · non-contrast
Comparison: None.

CLINICAL DATA: Chronic low back pain, bilateral hip pain for 1 year

EXAM:
MRI PELVIS WITHOUT CONTRAST
TECHNIQUE: Multiplanar multisequence MR imaging of the pelvis was performed. No
intravenous contrast was administered.

[Series 3: STIR · coronal · 3.0mm · 1.12mm/px · 9 of 39 slices shown]
[im 1/39]
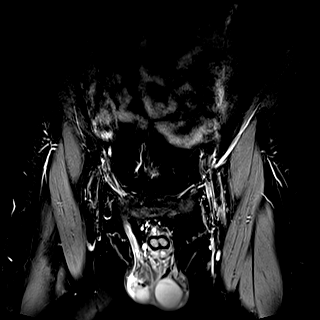
[im 5/39]
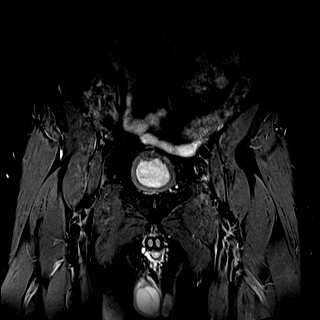
[im 10/39]
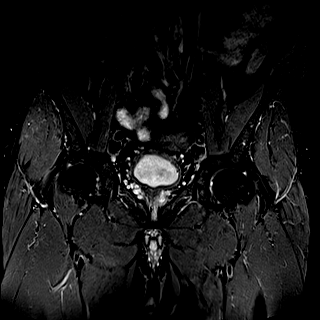
[im 15/39]
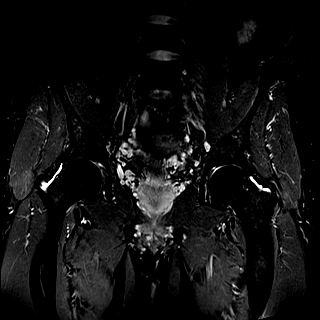
[im 20/39]
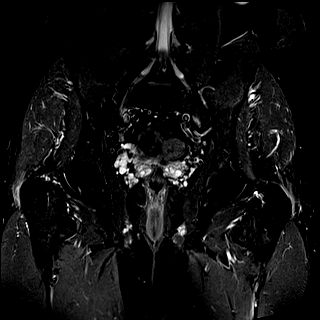
[im 24/39]
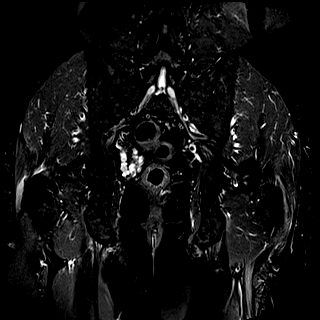
[im 29/39]
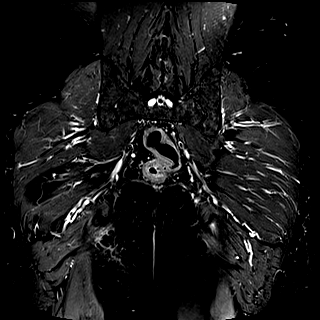
[im 34/39]
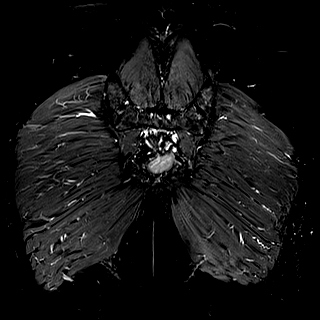
[im 39/39]
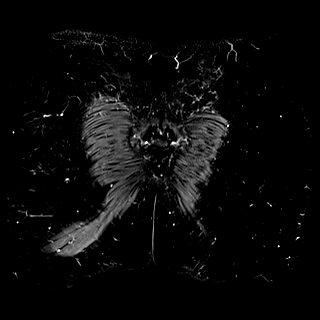

[Series 4: T1 · coronal · 3.0mm · 0.78mm/px · 8 of 39 slices shown]
[im 1/39]
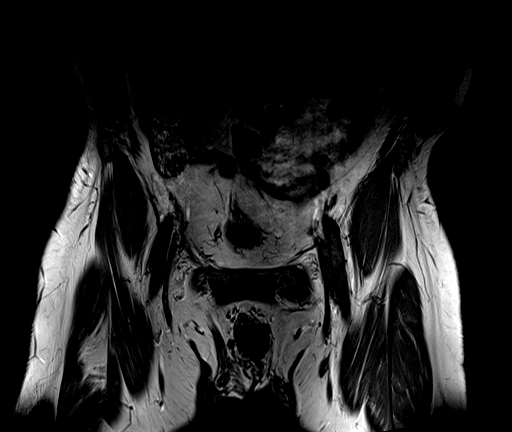
[im 5/39]
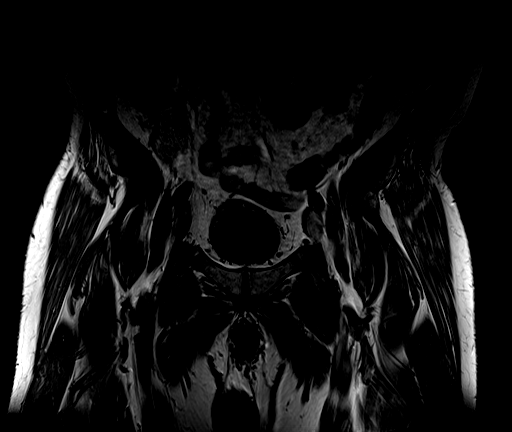
[im 13/39]
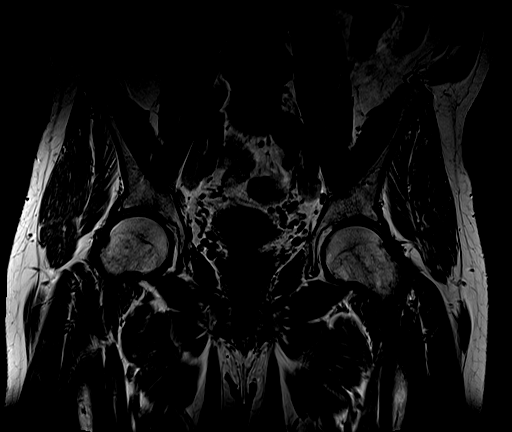
[im 17/39]
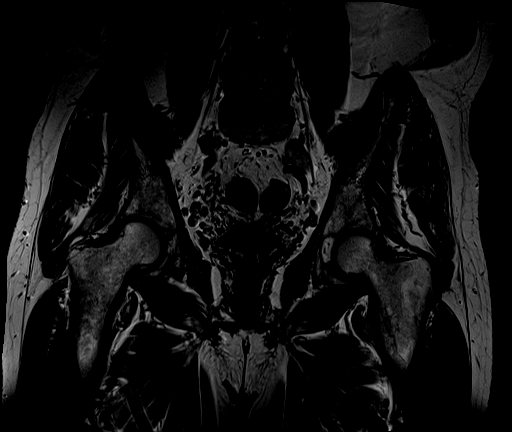
[im 22/39]
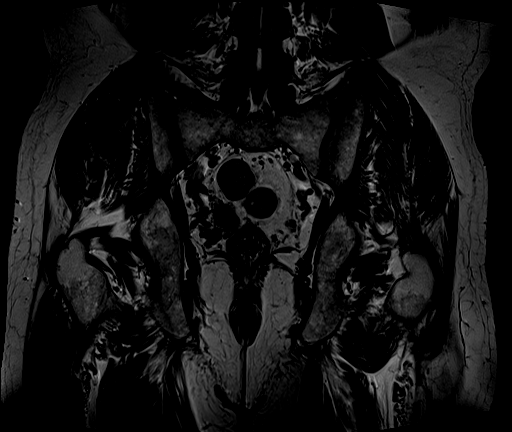
[im 26/39]
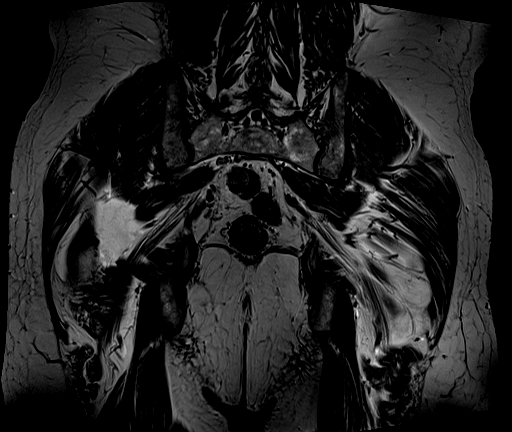
[im 34/39]
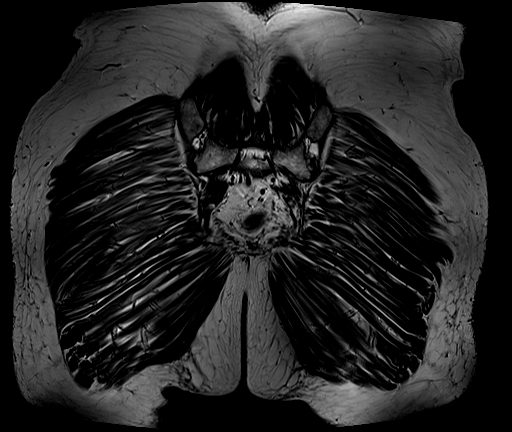
[im 39/39]
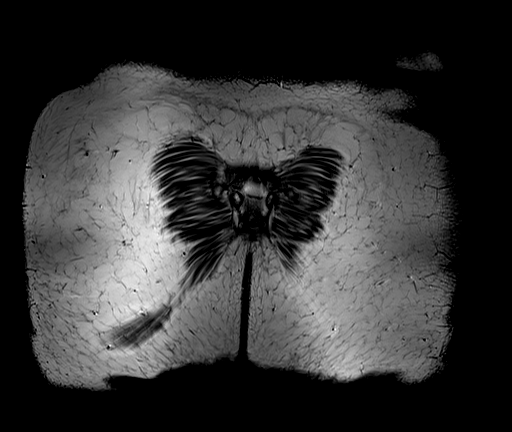

[Series 5: T2 fat-sat · sagittal · 5.0mm · 0.69mm/px · 9 of 45 slices shown (1 of 2)]
[im 1/45]
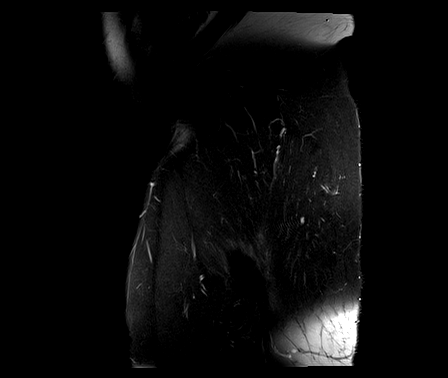
[im 9/45]
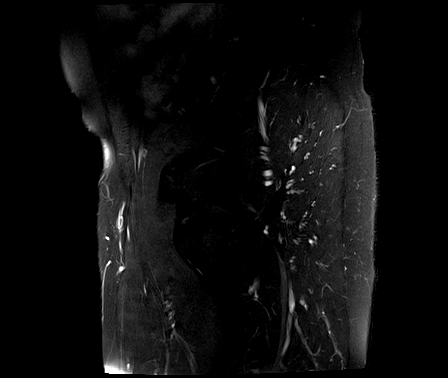
[im 13/45]
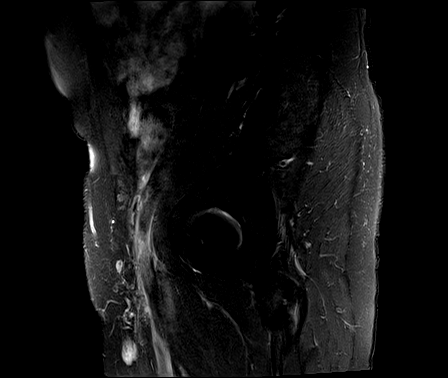
[im 21/45]
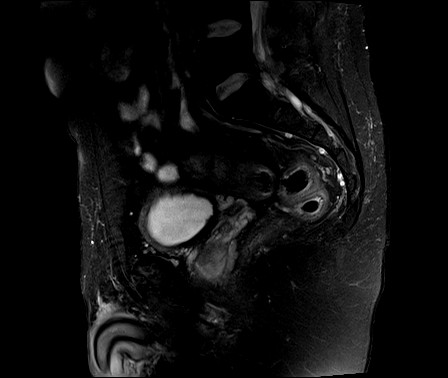
[im 25/45]
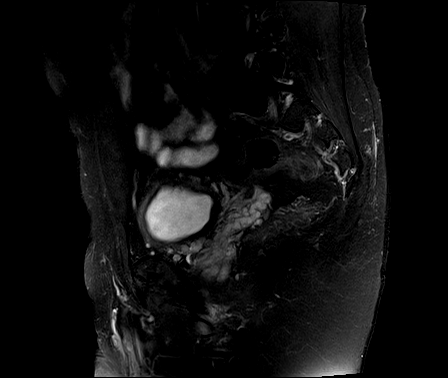
[im 33/45]
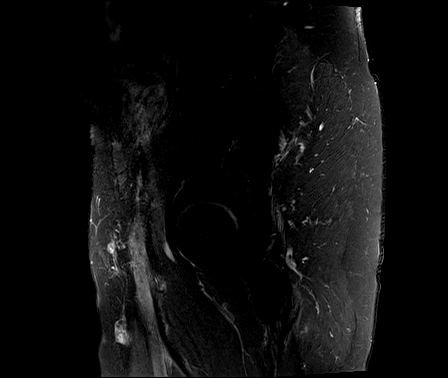
[im 37/45]
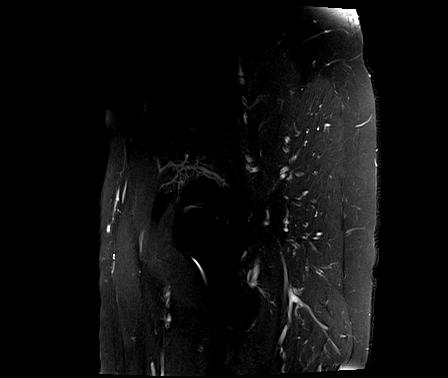
[im 41/45]
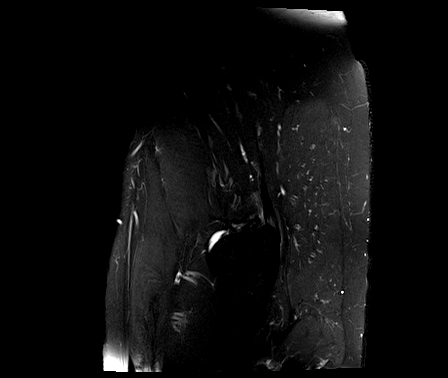
[im 45/45]
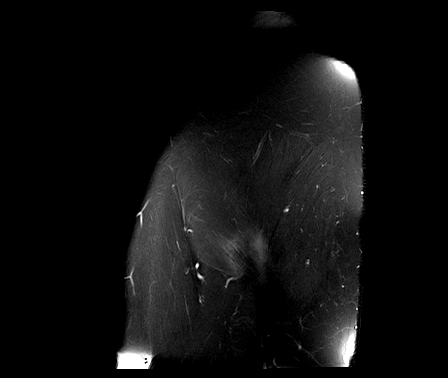

[Series 6: T2 fat-sat · axial · 5.0mm · 0.78mm/px · z∈[+46,+186]mm · 3 of 30 slices shown (2 of 2)]
[im 5/30]
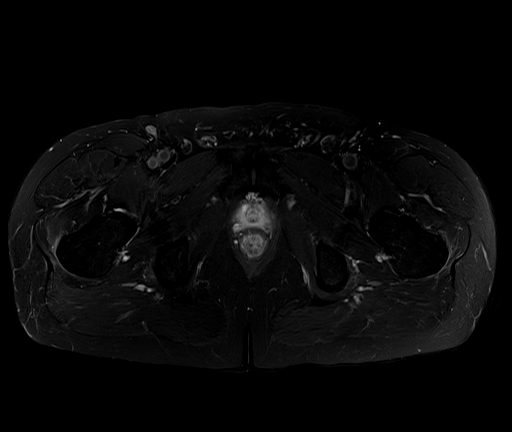
[im 17/30]
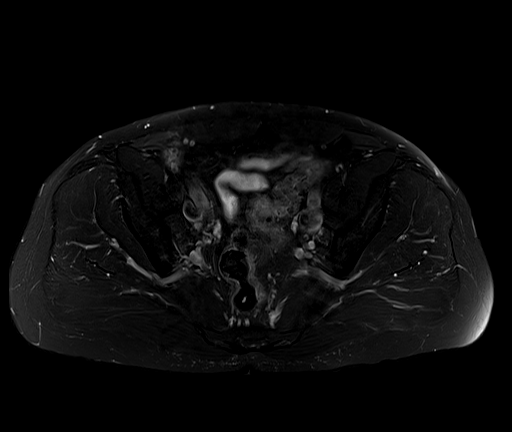
[im 25/30]
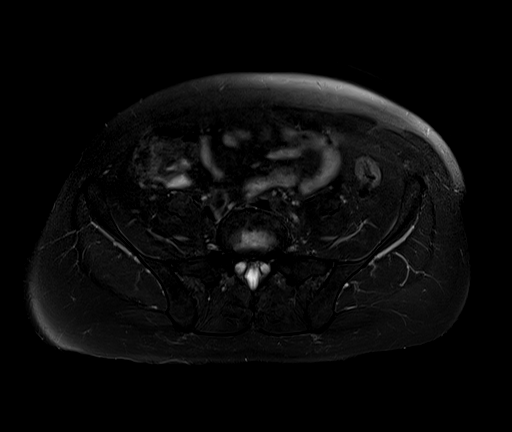

[29 of 48 positions shown; findings below may reference images not displayed]

FINDINGS: Bones: No hip fracture, dislocation or avascular necrosis. No
periosteal reaction or bone destruction. No aggressive osseous
lesion.

Normal sacrum and sacroiliac joints. No SI joint widening or erosive
changes.

Articular cartilage and labrum

Articular cartilage:  No chondral defect.

Labrum: Grossly intact, but evaluation is limited by lack of
intraarticular fluid.

Joint or bursal effusion

Joint effusion:  No hip joint effusion.  No SI joint effusion.

Bursae:  No bursa formation.

Muscles and tendons

Flexors: Normal.

Extensors: Normal.

Abductors: Normal.

Adductors: Normal.

Gluteals: Moderate tendinosis of the gluteus medius tendon insertion
bilaterally with small partial tears. Mild tendinosis of the left
gluteus minimus tendon insertion.

Hamstrings: Mild tendinosis of the hamstring origin bilaterally with
small interstitial tears.

Other findings

Miscellaneous: No pelvic free fluid. No fluid collection or
hematoma. No inguinal lymphadenopathy. No inguinal hernia. Relative
bladder wall thickening which may be secondary to underdistention
versus bladder outlet obstruction.
IMPRESSION: 1. Moderate tendinosis of the gluteus medius tendon insertion
bilaterally with small partial tears. Mild tendinosis of the left
gluteus minimus tendon insertion.
2. Mild tendinosis of the hamstring origin bilaterally with small
interstitial tears.
3. Relative bladder wall thickening which may be secondary to
underdistention versus bladder outlet obstruction.

## 2020-05-08 ENCOUNTER — Other Ambulatory Visit: Payer: Self-pay | Admitting: Family Medicine

## 2020-07-17 ENCOUNTER — Ambulatory Visit: Payer: 59 | Admitting: Family Medicine

## 2020-08-07 NOTE — Patient Instructions (Addendum)
Health Maintenance Due  Topic Date Due  . INFLUENZA VACCINE holding off for now. Consider perhaps 2 weeks after later.  03/30/2020  . COVID-19 Vaccine (3 - Booster for Moderna series) Getting today at 12pm. Please send [place] a message after you receive this.  05/29/2020    blood pressure looks great today. Continue amlodipine 5 mg. Home goal average <135/85.   - wants to lose about 10 lbs over the next year- asked him to let me know if starts to get lightheaded or bp trends down as may need to reduce dose (say if BP <110/70 regularly)   Recommended follow up: Return in about 6 months (around 02/06/2021) for physical or sooner if needed.

## 2020-08-07 NOTE — Progress Notes (Signed)
Phone [phone] In person visit   Subjective:   Daniel Blair is a 54 y.o. year old very pleasant male patient who presents for/with See problem oriented charting Chief Complaint  Patient presents with  . Hypertension    No taking BP at home   . Ear Wax    Pt feels like he has a lot of ear wax in his ears. He was recently on a flight his ears popped and they feel funny.    This visit occurred during the SARS-CoV-2 public health emergency.  Safety protocols were in place, including screening questions prior to the visit, additional usage of staff PPE, and extensive cleaning of exam room while observing appropriate contact time as indicated for disinfecting solutions.   Past Medical History-  Patient Active Problem List   Diagnosis Date Noted  . History of polymyalgia rheumatica 10/09/2018    Priority: High  . Essential hypertension 09/30/2017    Priority: Medium  . Herpes zoster ophthalmicus 04/24/2015    Priority: Medium  . Morton's neuralgia, right 03/06/2020  . Myalgia 02/05/2018  . Arthralgia 02/03/2018  . Erectile dysfunction 09/30/2017    Medications- reviewed and updated Current Outpatient Medications  Medication Sig Dispense Refill  . amLODipine (NORVASC) 5 MG tablet TAKE 1 TABLET BY MOUTH EVERY DAY 90 tablet 3  . Ascorbic Acid (VITAMIN C) 1000 MG tablet Take 1,000 mg by mouth daily.    . fluorouracil (EFUDEX) 5 % cream     . olopatadine (PATANOL) 0.1 % ophthalmic solution 1 drop as needed.     Marland Kitchen OVER THE COUNTER MEDICATION daily. Juice Plus daily    . sildenafil (VIAGRA) 100 MG tablet Take 1 tablet (100 mg total) by mouth daily as needed for erectile dysfunction. 10 tablet 5   No current facility-administered medications for this visit.     Objective:  BP 124/85   Pulse 67   Temp 98.3 F (36.8 C) (Temporal)   Ht 5\' 11"  (1.803 m)   Wt 182 lb 3.2 oz (82.6 kg)   SpO2 97%   BMI 25.41 kg/m  Gen: NAD, resting comfortably CV: RRR no murmurs rubs or  gallops Lungs: CTAB no crackles, wheeze, rhonchi Ext: no edema Skin: warm, dry    Assessment and Plan   #Social update-patient with a lot of work stress last visit making it harder to sleep.  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 Continue current meds. Home goal average <135/85.  - wants to lose about 10 lbs over the next year (reduce bad carbs mainly)- asked him to let me know if starts to get lightheaded or bp trends down as may need to reduce dose -We also discussed cholesterol is not at ideal goals but his 10-year risk of heart attack or stroke is in low risk territory-would not recommend cholesterol medicine at this time  #History of polymyalgia rheumatica-now off steroids for almost a year.  Some age-appropriate aches and pains still.  Had considered returning to Dr. Rondel Oh for lingering back pain/stiffness-we referred him to Dr. Tamala Julian in sports medicine last visit -he ended up seeing Dr. Micheline Chapman but primarily for foot pain- diagnosed with morton's neuralgia on ultrasound -Also was having some upset stomach and wondered if  it could be his vitamin C, multivitamin, turmeric-he reports doing better as long as takes on full stomach   #Erectile dysfunction-has not needed sildenafil recently we have kept on medication list in case he needs this again  #SIgnificant sneezes-saw allergist and had some sensitivities.  Prescription Flonase did not help  #Health maintenance -Flu shot- defer for now- may change mind and get later in season- -Shingrix- possibly next visit  -Covid-19 booster- planned today  Immunization History  Administered Date(s) Administered  . Influenza Split 06/09/2012  .  Influenza,inj,Quad PF,6+ Mos 09/30/2017, 06/06/2018  . Moderna SARS-COVID-2 Vaccination 10/31/2019, 11/28/2019  . Td 08/30/2002  . Tdap 01/02/2015   Recommended follow up: Return in about 6 months (around 02/06/2021) for physical or sooner if needed.  Lab/Order associations:   ICD-10-CM   1. Essential hypertension  I10     No orders of the defined types were placed in this encounter.  Return precautions advised.  Garret Reddish, MD

## 2020-08-08 ENCOUNTER — Ambulatory Visit: Payer: 59 | Admitting: Family Medicine

## 2020-08-08 ENCOUNTER — Encounter: Payer: Self-pay | Admitting: Family Medicine

## 2020-08-08 ENCOUNTER — Other Ambulatory Visit: Payer: Self-pay

## 2020-08-08 VITALS — BP 124/85 | HR 67 | Temp 98.3°F | Ht 71.0 in | Wt 182.2 lb

## 2020-08-08 DIAGNOSIS — I10 Essential (primary) hypertension: Secondary | ICD-10-CM | POA: Diagnosis not present

## 2020-10-20 ENCOUNTER — Encounter: Payer: Self-pay | Admitting: Family Medicine

## 2020-10-20 NOTE — Telephone Encounter (Signed)
Patient has been scheduled

## 2021-01-30 ENCOUNTER — Encounter: Payer: 59 | Admitting: Family Medicine

## 2021-05-14 ENCOUNTER — Ambulatory Visit: Payer: 59 | Admitting: Podiatry

## 2021-05-14 ENCOUNTER — Encounter: Payer: Self-pay | Admitting: Podiatry

## 2021-05-14 ENCOUNTER — Other Ambulatory Visit: Payer: Self-pay

## 2021-05-14 ENCOUNTER — Ambulatory Visit (INDEPENDENT_AMBULATORY_CARE_PROVIDER_SITE_OTHER): Payer: 59

## 2021-05-14 DIAGNOSIS — M7751 Other enthesopathy of right foot: Secondary | ICD-10-CM | POA: Diagnosis not present

## 2021-05-14 DIAGNOSIS — M2041 Other hammer toe(s) (acquired), right foot: Secondary | ICD-10-CM | POA: Diagnosis not present

## 2021-05-14 DIAGNOSIS — M779 Enthesopathy, unspecified: Secondary | ICD-10-CM | POA: Diagnosis not present

## 2021-05-14 MED ORDER — TRIAMCINOLONE ACETONIDE 10 MG/ML IJ SUSP
10.0000 mg | Freq: Once | INTRAMUSCULAR | Status: AC
  Administered 2021-05-14: 10 mg

## 2021-05-15 NOTE — Progress Notes (Signed)
Subjective:   Patient ID: Daniel Blair, male   DOB: 55 y.o.   MRN: [id number]   HPI Patient states he has a lot of pain in his right forefoot and its been going on for several months and may be longer than that and tender when he tries to walk.  Patient does have some digital deformities also but is not aware of how long its been present and does not smoke likes to be active   Review of Systems  All other systems reviewed and are negative.      Objective:  Physical Exam Vitals and nursing note reviewed.  Constitutional:      Appearance: He is well-developed.  Pulmonary:     Effort: Pulmonary effort is normal.  Musculoskeletal:        General: Normal range of motion.  Skin:    General: Skin is warm.  Neurological:     Mental Status: He is alert.    Neurovascular status intact muscle strength was found to be adequate inflammation noted in the second over third metatarsal phalangeal joint right with moderate elevation of the digits with hammertoe deformities and elongated digits with contracture.  Patient is noted to have good digital perfusion well oriented x3     Assessment:  Acute capsulitis of the second MPJ right with elongated digit and elongated metatarsal with mild discomfort of the third MPJ     Plan:  H&P educated him on condition and discussed flexor plate issues or stretch.  At this point I do think the acute to focus on the acute inflammation.  I did a sterile prep I then anesthetized the right forefoot and aspirated the second MPJ getting out a small amount of clear fluid injected quarter cc dexamethasone Kenalog apply thick plantar padding and did discuss possible long-term orthotics and consideration for possible digital shortening osteotomy procedure if symptoms do not get better  X-rays indicate that there is elongated second and third toes moderate contracture with elongated underlying metatarsal bones

## 2021-06-11 ENCOUNTER — Other Ambulatory Visit: Payer: Self-pay

## 2021-06-11 ENCOUNTER — Encounter: Payer: Self-pay | Admitting: Podiatry

## 2021-06-11 ENCOUNTER — Ambulatory Visit: Payer: 59 | Admitting: Podiatry

## 2021-06-11 DIAGNOSIS — D3610 Benign neoplasm of peripheral nerves and autonomic nervous system, unspecified: Secondary | ICD-10-CM | POA: Diagnosis not present

## 2021-06-11 DIAGNOSIS — M2041 Other hammer toe(s) (acquired), right foot: Secondary | ICD-10-CM | POA: Diagnosis not present

## 2021-06-11 DIAGNOSIS — M779 Enthesopathy, unspecified: Secondary | ICD-10-CM

## 2021-06-11 MED ORDER — DICLOFENAC SODIUM 75 MG PO TBEC: 75.0000 mg | DELAYED_RELEASE_TABLET | Freq: Two times a day (BID) | ORAL | 2 refills | Status: DC

## 2021-06-11 NOTE — Progress Notes (Signed)
Subjective:   Patient ID: Daniel Blair, male   DOB: 55 y.o.   MRN: [id number]   HPI Patient states he is improving but still has quite a bit of pain in his right forefoot and states that it is gradually better but still is quite sore and he is getting ready to run a half marathon in the next 2 weeks   ROS      Objective:  Physical Exam  Neurovascular status intact elongated second digit right with moderate rigid contracture with still inflammation around the second MPJ and also found to have inflammation with slight discomfort in the third intermetatarsal space right but appears to be more compensatory     Assessment:  Inflammatory capsulitis still occurring second MPJ right with structural deformity along with probability for compensatory neuroma symptomatology     Plan:  H&P spent a great deal of time educating him on condition and recommended the continuation of padding rigid bottom shoes and placed him on oral anti-inflammatory.  I do think long-term orthotics will be necessary with thick metatarsal padding and possible dispersion pad around the second MPJ right and I am referring him to ped orthotist for this to be done with casting to be accomplished.  We discussed the possibility for digital fusion shortening osteotomy at 1 point in future if we are not able to get symptoms under control

## 2021-06-29 NOTE — Progress Notes (Signed)
Phone: [phone]   Subjective:  Patient presents today for their annual physical. Chief complaint-noted.   See problem oriented charting- ROS- full  review of systems was completed and negative  except for: hammer toe and pain, ear wax, more frequent bowels- more gas, more urgency  The following were reviewed and entered/updated in epic: Past Medical History:  Diagnosis Date   Hamstring strain, initial encounter 09/06/2017    Chronic > 6 mos High on left HS   History of shingles    Hypertension    Polymyalgia rheumatica ([place])    on prednisone   Sneezing 09/30/2017   Patient Active Problem List   Diagnosis Date Noted   History of polymyalgia rheumatica 10/09/2018    Priority: High   Essential hypertension 09/30/2017    Priority: Medium    Herpes zoster ophthalmicus 04/24/2015    Priority: Medium    Morton's neuralgia, right 03/06/2020   Myalgia 02/05/2018   Arthralgia 02/03/2018   Erectile dysfunction 09/30/2017   Past Surgical History:  Procedure Laterality Date   HAND SURGERY Right    2012-7 screws and a pin   ingrown toenail     as a child with sedation   WISDOM TOOTH EXTRACTION      Family History  Problem Relation Age of Onset   Heart disease Father        CABG age 14, died 57. Smoker, far different lifestyle.    Cancer Father        throat, smoker   Esophageal cancer Father    Hypertension Other        mother's side   Colon cancer Neg Hx    Colon polyps Neg Hx    Stomach cancer Neg Hx    Rectal cancer Neg Hx     Medications- reviewed and updated Current Outpatient Medications  Medication Sig Dispense Refill   amLODipine (NORVASC) 5 MG tablet TAKE 1 TABLET BY MOUTH EVERY DAY 90 tablet 3   Ascorbic Acid (VITAMIN C) 1000 MG tablet Take 1,000 mg by mouth daily.     Multiple Vitamin (MULTIVITAMIN PO) Take by mouth.     Turmeric (QC TUMERIC COMPLEX PO) Take by mouth.     diclofenac (VOLTAREN) 75 MG EC tablet Take 1 tablet (75 mg total) by mouth 2  (two) times daily. 50 tablet 2   No current facility-administered medications for this visit.    Allergies-reviewed and updated No Known Allergies  Social History   Social History Narrative   Married. 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       Caffeine use: 3 cup in the morning , no soda      Hobbies: exercise, play golf, read   Objective  Objective:  BP 128/80   Pulse 63   Temp 98.6 F (37 C)   Ht 5\' 11"  (1.803 m)   Wt 183 lb 12.8 oz (83.4 kg)   SpO2 97%   BMI 25.63 kg/m  Gen: NAD, resting comfortably HEENT: Mucous membranes are moist. Oropharynx normal Neck: no thyromegaly CV: RRR no murmurs rubs or gallops Lungs: CTAB no crackles, wheeze, rhonchi Abdomen: soft/nontender/nondistended/normal bowel sounds. No rebound or guarding.  Ext: no edema Skin: warm, dry Neuro: grossly normal, moves all extremities, PERRLA    Assessment and Plan  55 y.o. male presenting for annual physical.  Health Maintenance counseling: 1. Anticipatory guidance: Patient counseled regarding regular dental exams -q6 months, eye exams -yearly ,  avoiding smoking and second hand smoke , limiting alcohol to 2 beverages per day.  No illicit drugs.  2. Risk factor reduction:  Advised patient of need for regular exercise and diet rich and fruits and vegetables to reduce risk of heart attack and stroke. Exercise- usually 4-6x's a week runs or walks- goes to [employer] regularly still.  Diet-Healthy. Long term home scale goal 175.  Wt Readings from Last 3 Encounters:  07/06/21 183 lb 12.8 oz (83.4 kg)  08/08/20 182 lb 3.2 oz (82.6 kg)  03/06/20 182 lb (82.6 kg)  3. Immunizations/screenings/ancillary studies DISCUSSED:  -Flu vaccination (last one 10/19) - today -COVID booster vaccination x2 and 1 booster in december-  had covid in may and lost weight to 170s- felt bad with this. Wants to hold of ffor now -Shingrix vaccination #1-  had shingles 5 years ago- plans to do this next  year Immunization History  Administered Date(s) Administered   Influenza Split 06/09/2012   Influenza,inj,Quad PF,6+ Mos 09/30/2017, 06/06/2018, 07/06/2021   Moderna Sars-Covid-2 Vaccination 10/31/2019, 11/28/2019   Td 08/30/2002   Tdap 01/02/2015  4. Prostate cancer screening-  update psa with labs. Defer rectal unless psa trend concerning.    Lab Results  Component Value Date   PSA 0.37 01/11/2020   PSA 0.46 10/09/2018   PSA 0.62 10/27/2009   5. Colon cancer screening - colonoscopy 05/31/2019 with a 7 year repeat planned - Adenoma in 2020.  6. Skin cancer screening- dermatology yearly traditionally. advised regular sunscreen use. Denies worrisome, changing, or new skin lesions.  7. Smoking associated screening (lung cancer screening, AAA screen 65-75, UA)- never smoker so no screening needed 8. STD screening -monogamous with wife  Status of chronic or acute concerns   #Social update-daughter getting married soon- wants to lose 7-8 lbs. Stress with business.   #hypertension S: medication: Amlodipine 5Mg  every day Home readings #s: does not check regularly but has cuff BP Readings from Last 3 Encounters:  07/06/21 128/80  08/08/20 124/85  03/06/20 110/82  A/P: Controlled. Continue current medications.  - he wants to work on weight loss and perhaps try half tablet after weight loss with home monitoring  #hyperlipidemia S: Medication:none  Lab Results  Component Value Date   CHOL 163 01/11/2020   HDL 49.90 01/11/2020   [place] 87 01/11/2020   LDLDIRECT 123.6 05/30/2012   TRIG 129.0 01/11/2020   CHOLHDL 3 01/11/2020   A/P: Very mild hyperlipidemia but family history of CAD in father starting at age 90 even though lifestyles are far different/patient's is far better-we opted to get coronary artery calcium scoring for additional information and planning on potential statin or further dietary changes -thinks has some space to cut down on oils such as fries or cut down on  cheese  #History of polymyalgia rheumatica-been off steroids for almost 2 years.  Some age-appropriate aches and pains still.    #Capsulitis second MPJ right foot-using diclofenac as needed through Dr. Paulla Dolly - discussed ok to trial    #Erectile dysfunction-had not needed sildenafil recently - doing well recently   #SIgnificant sneezes-saw allergist and had some sensitivities.  Prescription Flonase did not help  #looser stools/gas - does get some irregular bowels and with some urges sometimes without need for BM and has a lot of gas. Wife still with gassy and looser stools as well. They eat similar foods nad not sure of substantial changes in last year.   Recommended follow up: Return in about 1 year (around 07/06/2022) for  physical or sooner if needed. .  Lab/Order associations:NOT fasting   ICD-10-CM   1. Preventative health care  Z00.00 CBC with Differential/Platelet    Comprehensive metabolic panel    Lipid panel    PSA    2. Essential hypertension  I10 CBC with Differential/Platelet    Comprehensive metabolic panel    Lipid panel    CT CARDIAC SCORING (SELF PAY ONLY)    3. Need for immunization against influenza  Z23 Flu Vaccine QUAD 80mo+IM (Fluarix, Fluzone & Alfiuria Quad PF)    4. Screening for prostate cancer  Z12.5 PSA    5. Mild hyperlipidemia  E78.5 CT CARDIAC SCORING (SELF PAY ONLY)      No orders of the defined types were placed in this encounter.   I,Jada Bradford,acting as a scribe for Garret Reddish, MD.,have documented all relevant documentation on the behalf of Garret Reddish, MD,as directed by  Garret Reddish, MD while in the presence of Garret Reddish, MD.  I, Garret Reddish, MD, have reviewed all documentation for this visit. The documentation on 07/06/21 for the exam, diagnosis, procedures, and orders are all accurate and complete.  Return precautions advised.  Garret Reddish, MD

## 2021-07-06 ENCOUNTER — Ambulatory Visit (INDEPENDENT_AMBULATORY_CARE_PROVIDER_SITE_OTHER): Payer: 59 | Admitting: Family Medicine

## 2021-07-06 ENCOUNTER — Other Ambulatory Visit: Payer: Self-pay

## 2021-07-06 ENCOUNTER — Encounter: Payer: Self-pay | Admitting: Family Medicine

## 2021-07-06 VITALS — BP 128/80 | HR 63 | Temp 98.6°F | Ht 71.0 in | Wt 183.8 lb

## 2021-07-06 DIAGNOSIS — Z23 Encounter for immunization: Secondary | ICD-10-CM | POA: Diagnosis not present

## 2021-07-06 DIAGNOSIS — Z125 Encounter for screening for malignant neoplasm of prostate: Secondary | ICD-10-CM

## 2021-07-06 DIAGNOSIS — I10 Essential (primary) hypertension: Secondary | ICD-10-CM

## 2021-07-06 DIAGNOSIS — E785 Hyperlipidemia, unspecified: Secondary | ICD-10-CM

## 2021-07-06 DIAGNOSIS — Z0000 Encounter for general adult medical examination without abnormal findings: Secondary | ICD-10-CM

## 2021-07-06 NOTE — Patient Instructions (Addendum)
Health Maintenance Due  Topic Date Due   Zoster Vaccines- Shingrix (1 of 2)- consider after wedding and holidays Never done   COVID-19 bivalent booster- wants to hold off for now 01/23/2020   INFLUENZA VACCINE - Thank you for getting your regular dose flu shot today! 03/30/2021   Please stop by lab before you go If you have mychart- we will send your results within 3 business days of [place] receiving them.  If you do not have mychart- we will call you about results within 5 business days of [place] receiving them.  *please also note that you will see labs on mychart as soon as they post. I will later go in and write notes on them- will say "notes from Dr. Yong Channel"  Home BP goal <135/85 on average  We will call you within two weeks about your referral to coronary artery calcium scoring. If you do not hear within 2 weeks, give [place] a call.   Schedule follow up with dermatology   Recommended follow up: Return in about 1 year (around 07/06/2022) for physical or sooner if needed. Marland Kitchen

## 2021-07-06 NOTE — Addendum Note (Signed)
Addended by: Marin Olp on: 07/06/2021 08:03 PM   Modules accepted: Level of Service

## 2021-07-07 LAB — COMPREHENSIVE METABOLIC PANEL
ALT: 14 U/L (ref 0–53)
AST: 15 U/L (ref 0–37)
Albumin: 4.2 g/dL (ref 3.5–5.2)
Alkaline Phosphatase: 47 U/L (ref 39–117)
BUN: 17 mg/dL (ref 6–23)
CO2: 29 mEq/L (ref 19–32)
Calcium: 9.1 mg/dL (ref 8.4–10.5)
Chloride: 103 mEq/L (ref 96–112)
Creatinine, Ser: 0.87 mg/dL (ref 0.40–1.50)
GFR: 96.81 mL/min (ref >60.00)
Glucose, Bld: 109 mg/dL — ABNORMAL HIGH (ref 70–99)
Potassium: 4 mEq/L (ref 3.5–5.1)
Sodium: 140 mEq/L (ref 135–145)
Total Bilirubin: 0.3 mg/dL (ref 0.2–1.2)
Total Protein: 6.4 g/dL (ref 6.0–8.3)

## 2021-07-07 LAB — LIPID PANEL
Cholesterol: 167 mg/dL (ref 0–200)
HDL: 52.2 mg/dL (ref >39.00)
NonHDL: 114.58
Total CHOL/HDL Ratio: 3
Triglycerides: 261 mg/dL — ABNORMAL HIGH (ref 0.0–149.0)
VLDL: 52.2 mg/dL — ABNORMAL HIGH (ref 0.0–40.0)

## 2021-07-07 LAB — CBC WITH DIFFERENTIAL/PLATELET
Basophils Absolute: 0 10*3/uL (ref 0.0–0.1)
Basophils Relative: 0.4 % (ref 0.0–3.0)
Eosinophils Absolute: 0.1 10*3/uL (ref 0.0–0.7)
Eosinophils Relative: 2.2 % (ref 0.0–5.0)
HCT: 40.2 % (ref 39.0–52.0)
Hemoglobin: 13.3 g/dL (ref 13.0–17.0)
Lymphocytes Relative: 32.5 % (ref 12.0–46.0)
Lymphs Abs: 2.1 10*3/uL (ref 0.7–4.0)
MCHC: 33.1 g/dL (ref 30.0–36.0)
MCV: 92.2 fl (ref 78.0–100.0)
Monocytes Absolute: 0.5 10*3/uL (ref 0.1–1.0)
Monocytes Relative: 7.3 % (ref 3.0–12.0)
Neutro Abs: 3.7 10*3/uL (ref 1.4–7.7)
Neutrophils Relative %: 57.6 % (ref 43.0–77.0)
Platelets: 223 10*3/uL (ref 150.0–400.0)
RBC: 4.35 Mil/uL (ref 4.22–5.81)
RDW: 12.9 % (ref 11.5–15.5)
WBC: 6.4 10*3/uL (ref 4.0–10.5)

## 2021-07-07 LAB — PSA: PSA: 0.24 ng/mL (ref 0.10–4.00)

## 2021-07-07 LAB — LDL CHOLESTEROL, DIRECT: Direct LDL: 90 mg/dL

## 2021-07-14 ENCOUNTER — Other Ambulatory Visit: Payer: Self-pay | Admitting: Family Medicine

## 2021-08-04 ENCOUNTER — Ambulatory Visit (INDEPENDENT_AMBULATORY_CARE_PROVIDER_SITE_OTHER)
Admission: RE | Admit: 2021-08-04 | Discharge: 2021-08-04 | Disposition: A | Discharge status: Home / Self Care | Payer: Self-pay | Source: Ambulatory Visit | Attending: Family Medicine | Admitting: Family Medicine

## 2021-08-04 ENCOUNTER — Other Ambulatory Visit: Payer: Self-pay

## 2021-08-04 DIAGNOSIS — I10 Essential (primary) hypertension: Secondary | ICD-10-CM

## 2021-08-04 DIAGNOSIS — E785 Hyperlipidemia, unspecified: Secondary | ICD-10-CM

## 2021-10-27 ENCOUNTER — Telehealth: Payer: Self-pay | Admitting: Family Medicine

## 2021-10-27 MED ORDER — AMLODIPINE BESYLATE 5 MG PO TABS: 5.0000 mg | ORAL_TABLET | Freq: Every day | ORAL | 3 refills | Status: DC

## 2021-10-27 NOTE — Telephone Encounter (Signed)
Medication refilled

## 2021-10-27 NOTE — Telephone Encounter (Signed)
Patient needs a refill for amLODipine (NORVASC) 5 MG tablet  [facility] on file.  Patient has a physical sch for 07/09/22 with Dr Yong Channel.

## 2022-05-24 ENCOUNTER — Encounter: Payer: Self-pay | Admitting: *Deleted

## 2022-07-09 ENCOUNTER — Encounter: Payer: Self-pay | Admitting: Family Medicine

## 2022-08-12 ENCOUNTER — Encounter: Payer: Self-pay | Admitting: *Deleted

## 2022-11-01 ENCOUNTER — Other Ambulatory Visit: Payer: Self-pay | Admitting: Family Medicine

## 2022-11-04 ENCOUNTER — Encounter: Payer: Self-pay | Admitting: Family Medicine

## 2022-11-04 ENCOUNTER — Ambulatory Visit (INDEPENDENT_AMBULATORY_CARE_PROVIDER_SITE_OTHER): Payer: Managed Care, Other (non HMO) | Admitting: Family Medicine

## 2022-11-04 VITALS — BP 128/80 | HR 66 | Temp 98.2°F | Ht 71.0 in | Wt 183.2 lb

## 2022-11-04 DIAGNOSIS — I10 Essential (primary) hypertension: Secondary | ICD-10-CM

## 2022-11-04 MED ORDER — AMLODIPINE BESYLATE 5 MG PO TABS: 5.0000 mg | ORAL_TABLET | Freq: Every day | ORAL | 3 refills | Status: DC

## 2022-11-04 NOTE — Patient Instructions (Addendum)
I agree with you on scheduling follow up with eye doctor  Would recommend shingles shots #1 and #2 but I would have a light 3 days afterwards just in case you have more significant immune response. 2nd shot is 2-6 months after 1st shot.   Since doing bloodwork with [facility]- my last PSA was 07/06/21 with you- would love to have that updated as well- if they do not do that- we could have you come back by here for that  Recommended follow up: Return in about 6 months (around 05/07/2023) for physical or sooner if needed.Schedule b4 you leave.

## 2022-11-04 NOTE — Progress Notes (Signed)
Phone [phone] In person visit   Subjective:   Daniel Blair is a 57 y.o. year old very pleasant male patient who presents for/with See problem oriented charting Chief Complaint  Patient presents with   Annual Exam    (No Mask)   Hypertension    Past Medical History-  Patient Active Problem List   Diagnosis Date Noted   History of polymyalgia rheumatica 10/09/2018    Priority: High   Essential hypertension 09/30/2017    Priority: Medium    Herpes zoster ophthalmicus 04/24/2015    Priority: Medium    Erectile dysfunction 09/30/2017    Priority: Low   Morton's neuralgia, right 03/06/2020    Priority: 1.   Arthralgia 02/03/2018    Priority: 1.   Myalgia 02/05/2018    Medications- reviewed and updated Current Outpatient Medications  Medication Sig Dispense Refill   Ascorbic Acid (VITAMIN C) 1000 MG tablet Take 1,000 mg by mouth daily.     diclofenac (VOLTAREN) 75 MG EC tablet Take 1 tablet (75 mg total) by mouth 2 (two) times daily. 50 tablet 2   Multiple Vitamin (MULTIVITAMIN PO) Take by mouth.     Turmeric (QC TUMERIC COMPLEX PO) Take by mouth.     amLODipine (NORVASC) 5 MG tablet Take 1 tablet (5 mg total) by mouth daily. 90 tablet 3   No current facility-administered medications for this visit.     Objective:  BP 128/80   Pulse 66   Temp 98.2 F (36.8 C)   Ht '5\' 11"'$  (1.803 m)   Wt 183 lb 3.2 oz (83.1 kg)   SpO2 98%   BMI 25.55 kg/m  Gen: NAD, resting comfortably Tympanic membrane normal on left, on right completely obstructed by cerumen- able to curette out a portion of this CV: RRR no murmurs rubs or gallops Lungs: CTAB no crackles, wheeze, rhonchi Abdomen: soft/nontender/nondistended/normal bowel sounds. No rebound or guarding.  Ext: no edema Skin: warm, dry    Assessment and Plan  57 y.o. male presenting for annual follow up (will schedule CPE later) Health Maintenance counseling: 1. Anticipatory guidance: Patient counseled regarding  regular dental exams -q6 months, eye exams - plans to schedule,  avoiding smoking and second hand smoke , limiting alcohol to 2 beverages per day - 4 a week, no illicit drugs .   2. Risk factor reduction:  Advised patient of need for regular exercise and diet rich and fruits and vegetables to reduce risk of heart attack and stroke.  Exercise-still in the 4 to 6 days a week range-running/rucking/walking.  Diet/weight management-stable from last visit- had wanted mild weight loss 7-8 lbs- eats healthy but still enjoys desserts etc .  Wt Readings from Last 3 Encounters:  11/04/22 183 lb 3.2 oz (83.1 kg)  07/06/21 183 lb 12.8 oz (83.4 kg)  08/08/20 182 lb 3.2 oz (82.6 kg)  3. Immunizations/screenings/ancillary studies-Shingrix-reports having shingles, flu shot- had at work, COVID shot- thinks he had 3 total- no recent covid  Immunization History  Administered Date(s) Administered   Influenza Split 06/09/2012   Influenza,inj,Quad PF,6+ Mos 09/30/2017, 06/06/2018, 07/06/2021, 06/13/2022   Moderna Sars-Covid-2 Vaccination 10/31/2019, 11/28/2019   Td 08/30/2002   Tdap 01/02/2015  4. Prostate cancer screening- low risk prior PSA trend-trend with labs at [facility] hopefully.  Defer rectal illness PSA trend concerning Lab Results  Component Value Date   PSA 0.24 07/06/2021   PSA 0.37 01/11/2020   PSA 0.46 10/09/2018   5. Colon cancer screening - 05/31/2019 colonoscopy and  plan is for 7 year repeat* with tubular adenoma 6. Skin cancer screening-sees dermatology yearly 7. Smoking associated screening (lung cancer screening, AAA screen 65-75, UA)-never smoker 8. STD screening -monogamous with wife  Status of chronic or acute concerns   #hypertension S: medication: Amlodipine 5 mg daily- last dose around 1 pm  yesterday Home readings #s: no recent checks BP Readings from Last 3 Encounters:  11/04/22 128/80  07/06/21 128/80  08/08/20 124/85  A/P: blood pressure reasonably well controlled - continue  current medications    #hyperlipidemia-CT calcium scoring of 0 on 08/04/2021 S: Medication:none  Lab Results  Component Value Date   CHOL 167 07/06/2021   HDL 52.20 07/06/2021   LDLCALC 87 01/11/2020   LDLDIRECT 90.0 07/06/2021   TRIG 261.0 (H) 07/06/2021   CHOLHDL 3 07/06/2021   A/P: would stay off meds for now- keep working on diet/exercise particularly to get triglycerides down   #no recurrence of PMR   Recommended follow up: Return in about 6 months (around 05/07/2023) for physical or sooner if needed.Schedule b4 you leave.  Lab/Order associations:   ICD-10-CM   1. Essential hypertension  I10       Meds ordered this encounter  Medications   amLODipine (NORVASC) 5 MG tablet    Sig: Take 1 tablet (5 mg total) by mouth daily.    Dispense:  90 tablet    Refill:  3   Return precautions advised.  Garret Reddish, MD

## 2023-04-14 ENCOUNTER — Encounter (INDEPENDENT_AMBULATORY_CARE_PROVIDER_SITE_OTHER): Payer: Self-pay

## 2023-05-13 ENCOUNTER — Encounter: Payer: Managed Care, Other (non HMO) | Admitting: Family Medicine

## 2023-07-20 ENCOUNTER — Encounter: Payer: Self-pay | Admitting: Internal Medicine

## 2023-07-20 ENCOUNTER — Ambulatory Visit: Payer: Managed Care, Other (non HMO) | Admitting: Internal Medicine

## 2023-07-20 VITALS — BP 125/85 | HR 61 | Temp 98.2°F | Ht 71.0 in | Wt 181.4 lb

## 2023-07-20 DIAGNOSIS — H6121 Impacted cerumen, right ear: Secondary | ICD-10-CM | POA: Diagnosis not present

## 2023-07-20 DIAGNOSIS — H9311 Tinnitus, right ear: Secondary | ICD-10-CM

## 2023-07-20 NOTE — Progress Notes (Signed)
[facility]: [phone]   -- Medical Office Visit --  Patient:  Daniel Blair      Age: 57 y.o.       Sex:  male  Date:   07/20/2023 Today's Healthcare Provider: Lula Olszewski, MD  ==========================================================================     Assessment & Plan Impacted cerumen of right ear Impacted Cerumen, Right Ear (H61.21)  Complete occlusion of right external auditory canal with hardened cerumen Associated tinnitus likely secondary to impaction Eardrum shows signs of oxygen deprivation with pale appearance Successfully treated with irrigation procedure Risk factors identified: recurrent impaction history, improper self-cleaning techniques  Plan:  Procedure Performed Today:  Cerumen removal by irrigation (82956) performed by clinical staff under physician supervision Progressive pressure technique utilized Complete removal achieved with visualization of TM Patient tolerated well without complications   Patient Education/Counseling (documented time: 15 minutes):  Discussed risks/benefits of irrigation vs. manual removal Reviewed proper ear hygiene techniques Instructed on preventive measures Provided guidance on early intervention strategies   Follow-up Plan:  PRN for recurrence Return if develops pain or drainage Consider preventive visits if recurrence frequent   Preventive Care Instructions:  Debrox drops 1-2x weekly for prevention Proper shower-based cleaning technique reviewed Avoid cotton swab insertion Consider Teviddler device for early intervention Tinnitus of right ear Tinnitus, Right Ear (H93.11)  Secondary to cerumen impaction No evidence of underlying infection Likely temporary due to pressure effects and oxygen deprivation of tympanic membrane which was pale on post irrigation exam. ED Discharge Orders     None     There are no diagnoses linked to this encounter. Recommended follow-up: No  follow-ups on file.No future appointments.Patient Care Team: Shelva Majestic, MD as PCP - General (Family Medicine) Eileen Stanford, MD as Referring Physician (Allergy and Immunology)    SUBJECTIVE: 57 y.o. male who has Herpes zoster ophthalmicus; Erectile dysfunction; Essential hypertension; Arthralgia; Myalgia; History of polymyalgia rheumatica; and Morton's neuralgia, right on their problem list.  Main reasons for visit/main concerns/chief complaint: Right ear wax removal (Can't hear.) and Otitis Media (Right ear.)    AI-Extracted: Discussed the use of AI scribe software for clinical note transcription with the patient, who gave verbal consent to proceed.  # History of Present Illness  Patient presents with chief complaint of "recurring earwax buildup" primarily affecting the right ear with associated tinnitus. Patient reports history of cerumen impaction requiring both irrigation and manual extraction procedures in the past. Current symptoms include sensation of blockage in right ear and persistent tinnitus. Patient initiated self-treatment with over-the-counter Debrox ear drops for past three days, reporting partial success with left ear but continued obstruction in right ear.  Patient denies otalgia and denies insertion of foreign objects including cotton swabs. Reports daily ear care routine of warm water irrigation during showering. Patient successfully extracted significant cerumen from left ear last night following Debrox treatment but right ear remains symptomatic. He reported a daily routine of rinsing his ears in the shower with warm water and shampoo, which he believed might help prevent wax buildup.  Patient has history of cerumen impaction and current medication limited to Debrox ear drops. Denies vertigo, otorrhea, hearing loss beyond the current blockage sensation, or prior ear surgeries.  **Social History:** Patient has demonstrated understanding of ear hygiene practices,  though current methods may be contributing to impaction. Expressed concern about seeking care for what was perceived as a minor issue, indicating good insight into preventive health maintenance.  **Review of Systems:** - ENT: Positive for right ear  blockage and tinnitus - Constitutional: Denies fever, fatigue - Neurological: Denies vertigo, balance issues - All other systems reviewed and negative    Note that patient  has a past medical history of Hamstring strain, initial encounter (09/06/2017), History of shingles, Hypertension, Polymyalgia rheumatica (HCC), and Sneezing (09/30/2017).  Problem list overviews that were updated at today's visit:No problems updated.  Med reconciliation: Current Outpatient Medications on File Prior to Visit  Medication Sig   amLODipine (NORVASC) 5 MG tablet Take 1 tablet (5 mg total) by mouth daily.   Ascorbic Acid (VITAMIN C) 1000 MG tablet Take 1,000 mg by mouth daily.   Multiple Vitamin (MULTIVITAMIN PO) Take by mouth.   Turmeric (QC TUMERIC COMPLEX PO) Take by mouth.   No current facility-administered medications on file prior to visit.   Medications Discontinued During This Encounter  Medication Reason   diclofenac (VOLTAREN) 75 MG EC tablet      Objective   Physical Exam     07/20/2023    4:03 PM 11/04/2022    3:28 PM 07/06/2021    1:25 PM  Vitals with BMI  Height 5\' 11"  5\' 11"  5\' 11"   Weight 181 lbs 6 oz 183 lbs 3 oz 183 lbs 13 oz  BMI 25.31 25.56 25.65  Systolic 125 128 191  Diastolic 85 80 80  Pulse 61 66 63   Wt Readings from Last 10 Encounters:  07/20/23 181 lb 6.4 oz (82.3 kg)  11/04/22 183 lb 3.2 oz (83.1 kg)  07/06/21 183 lb 12.8 oz (83.4 kg)  08/08/20 182 lb 3.2 oz (82.6 kg)  03/06/20 182 lb (82.6 kg)  01/11/20 185 lb (83.9 kg)  05/31/19 189 lb (85.7 kg)  05/01/19 189 lb (85.7 kg)  04/10/19 182 lb (82.6 kg)  10/09/18 190 lb 1.6 oz (86.2 kg)   Vital signs reviewed.  Nursing notes reviewed. Weight trend  reviewed. Abnormalities and Problem-Specific physical exam findings:  see procedure note  General Appearance:  No acute distress appreciable.   Well-groomed, healthy-appearing male.  Well proportioned with no abnormal fat distribution.  Good muscle tone. Pulmonary:  Normal work of breathing at rest, no respiratory distress apparent. SpO2: 97 %  Musculoskeletal: All extremities are intact.  Neurological:  Awake, alert, oriented, and engaged.  No obvious focal neurological deficits or cognitive impairments.  Sensorium seems unclouded.   Speech is clear and coherent with logical content. Psychiatric:  Appropriate mood, pleasant and cooperative demeanor, thoughtful and engaged during the exam   Procedure Note: Cerumen Removal by Ear Canal Irrigation Description: Progressively increasing pressure ear irrigation for the right ear. Initial attempts with lower pressure showed minimal results. Increased pressure resulted in significant cerumen removal. Final inspection revealed complete removal of cerumen, with the tympanic membrane visible but irritated. Informed Consent: Counseling on risks, including tympanic membrane perforation, temporary or permanent hearing loss, pain, and tinnitus. Alternatives discussed included Debrox cerumenolytic drops. Patient opted for irrigation with progressively increasing pressure.  Procedure: 47829 - Removal impacted cerumen using irrigation/lavage, unilateral Location: Right ear Performed by: Clinical staff member Governor Rooks under direct physician supervision Informed consent obtained after discussing risks/benefits Pre-procedure examination: Complete cerumen occlusion of right canal Technique: Progressive pressure irrigation with temperature-controlled solution Monitoring: Continuous visualization and patient feedback during procedure Outcome: Complete removal achieved with TM visualization Post-procedure findings: TM intact but showing signs of previous pressure  effect (pale macerated tympanic membrane) Complications: None Patient tolerance: Excellent Instructions: Reviewed post-procedure care and prevention strategies     Medical Decision Making Documentation:  Level: Moderate  Number and Complexity of Problems Addressed:  Moderate complexity: Acute illness with systemic symptoms (cerumen impaction affecting hearing and causing tinnitus) Required procedure for immediate resolution Risk factors for recurrence identified and addressed   Amount/Complexity of Data Reviewed:  Detailed examination of external auditory canals and TMs Assessment of impaction characteristics Evaluation of treatment response during procedure Review of prior interventions and their outcomes   Risk of Complications/Morbidity:  Moderate level of risk due to:  Procedure involving risk of TM perforation (discussed 1:500 risk) Potential for temporary or permanent hearing changes Risk of infection if improper technique used Need for careful pressure titration during irrigation

## 2023-07-20 NOTE — Patient Instructions (Signed)
#   After Visit Summary  ## Today's Visit You were seen today for earwax buildup (cerumen impaction) in your right ear. We successfully removed the impacted earwax using an irrigation procedure. Your eardrum appeared slightly irritated but intact after the procedure.  ## Your Conditions 1. Earwax Impaction (Right Ear) 2. Tinnitus (Ringing in Right Ear)  ## Today's Procedures - Ear irrigation with complete removal of impacted cerumen  ## What to Expect - Your hearing should improve immediately - The ringing in your ear may take a few days to resolve - Your ear canal may feel slightly wet for 24-48 hours - Mild discomfort is normal for 1-2 days  ## Home Care Instructions 1. Keep your ear dry for 24 hours 2. Do not insert any objects into your ear canal 3. Use preventive measures as discussed:    - Debrox drops 1-2 times weekly    - Continue gentle cleaning during showers    - Consider using a Teviddler device for early wax removal  ## Prevention Steps - Use Debrox or similar drops preventively - Avoid cotton swabs or finger insertion - Continue using warm water rinse during showers - Consider mineral oil or baby oil drops as alternatives  ## When to Call us Contact our office if you experience: - Ear pain - Drainage from your ear - Decreased hearing - Dizziness - Fever  ## Follow-Up Care - Return as needed if symptoms recur - Consider scheduling regular preventive cleanings if you have frequent buildups  ## Questions? Call our office at [facility] PHONE NUMBER] Available Monday-Friday, 8:00 AM - 5:00 PM

## 2024-01-01 ENCOUNTER — Other Ambulatory Visit: Payer: Self-pay | Admitting: Family Medicine

## 2024-01-02 ENCOUNTER — Other Ambulatory Visit: Payer: Self-pay | Admitting: Family Medicine

## 2024-01-02 NOTE — Telephone Encounter (Signed)
 Last Fill: 11/04/22  Last OV: 07/20/23 Next OV: None Scheduled  Routing to provider for review/authorization.

## 2024-01-02 NOTE — Telephone Encounter (Signed)
 Copied from CRM [id number]. Topic: Clinical - Medication Refill >> Jan 02, 2024  1:47 PM Howard Macho wrote: Most Recent Primary Care Visit:  Provider: MORRISON, RYAN G  Department: [facility]  Visit Type: SAME DAY  Date: 07/20/2023  Medication: amLODipine  (NORVASC ) 5 MG tablet  Has the patient contacted their pharmacy? Yes (Agent: If no, request that the patient contact the pharmacy for the refill. If patient does not wish to contact the pharmacy document the reason why and proceed with request.) (Agent: If yes, when and what did the pharmacy advise?)  Is this the correct pharmacy for this prescription? Yes If no, delete pharmacy and type the correct one.  This is the patient's preferred pharmacy:  [id number] - [place], [place] - [address] AT [facility] [address] [place] [address] Phone: [phone] Fax: [phone]  Has the prescription been filled recently? No  Is the patient out of the medication? Yes  Has the patient been seen for an appointment in the last year OR does the patient have an upcoming appointment? Yes  Can we respond through MyChart? Yes  Agent: Please be advised that Rx refills may take up to 3 business days. We ask that you follow-up with your pharmacy.

## 2024-01-03 NOTE — Telephone Encounter (Signed)
 Patient is calling in regarding his medication refill he would like a call back when the medication has been called into the pharmacy

## 2024-01-09 ENCOUNTER — Telehealth: Payer: Self-pay

## 2024-01-09 NOTE — Telephone Encounter (Signed)
 Please call and schedule ov for refills, pt has not been seen by dr. Arlene Ben since 10/2022.  Copied from CRM [id number]. Topic: Clinical - Medication Refill >> Jan 02, 2024  1:47 PM Daniel Blair wrote: Most Recent Primary Care Visit:  Provider: MORRISON, RYAN G  Department: [facility]  Visit Type: SAME DAY  Date: 07/20/2023  Medication: amLODipine  (NORVASC ) 5 MG tablet  Has the patient contacted their pharmacy? Yes (Agent: If no, request that the patient contact the pharmacy for the refill. If patient does not wish to contact the pharmacy document the reason why and proceed with request.) (Agent: If yes, when and what did the pharmacy advise?)  Is this the correct pharmacy for this prescription? Yes If no, delete pharmacy and type the correct one.  This is the patient's preferred pharmacy:  [id number] - [place], [place] - [address] AT [facility] [address] [place] [address] Phone: [phone] Fax: [phone]  Has the prescription been filled recently? No  Is the patient out of the medication? Yes  Has the patient been seen for an appointment in the last year OR does the patient have an upcoming appointment? Yes  Can we respond through MyChart? Yes  Agent: Please be advised that Rx refills may take up to 3 business days. We ask that you follow-up with your pharmacy. >> Jan 09, 2024 10:07 AM [name] wrote: Patient is calling to follow up on this request. He is out of the medication and has not gotten a response.

## 2024-01-13 ENCOUNTER — Encounter: Payer: Self-pay | Admitting: Family Medicine

## 2024-01-13 ENCOUNTER — Ambulatory Visit: Admitting: Family Medicine

## 2024-01-13 VITALS — BP 128/90 | HR 60 | Temp 97.7°F | Ht 71.0 in | Wt 178.8 lb

## 2024-01-13 DIAGNOSIS — R7303 Prediabetes: Secondary | ICD-10-CM | POA: Diagnosis not present

## 2024-01-13 DIAGNOSIS — I10 Essential (primary) hypertension: Secondary | ICD-10-CM | POA: Diagnosis not present

## 2024-01-13 DIAGNOSIS — E559 Vitamin D deficiency, unspecified: Secondary | ICD-10-CM

## 2024-01-13 DIAGNOSIS — E785 Hyperlipidemia, unspecified: Secondary | ICD-10-CM

## 2024-01-13 MED ORDER — AMLODIPINE BESYLATE 5 MG PO TABS: 5.0000 mg | ORAL_TABLET | Freq: Every day | ORAL | 3 refills | Status: AC

## 2024-01-13 NOTE — Progress Notes (Signed)
 Phone [phone] In person visit   Subjective:   Daniel Blair is a 58 y.o. year old very pleasant male patient who presents for/with See problem oriented charting Chief Complaint  Patient presents with   Hypertension   Past Medical History-  Patient Active Problem List   Diagnosis Date Noted   History of polymyalgia rheumatica 10/09/2018    Priority: High   Essential hypertension 09/30/2017    Priority: Medium    Herpes zoster ophthalmicus 04/24/2015    Priority: Medium    Erectile dysfunction 09/30/2017    Priority: Low   Morton's neuralgia, right 03/06/2020    Priority: 1.   Arthralgia 02/03/2018    Priority: 1.   Mild hyperlipidemia 01/13/2024   Vitamin D deficiency 01/13/2024   Prediabetes 01/13/2024   Myalgia 02/05/2018    Medications- reviewed and updated Current Outpatient Medications  Medication Sig Dispense Refill   Ascorbic Acid (VITAMIN C) 1000 MG tablet Take 1,000 mg by mouth daily.     Multiple Vitamin (MULTIVITAMIN PO) Take by mouth.     Turmeric (QC TUMERIC COMPLEX PO) Take by mouth.     amLODipine  (NORVASC ) 5 MG tablet Take 1 tablet (5 mg total) by mouth daily. 90 tablet 3   No current facility-administered medications for this visit.     Objective:  BP (!) 128/90   Pulse 60   Temp 97.7 F (36.5 C)   Ht 5\' 11"  (1.803 m)   Wt 178 lb 12.8 oz (81.1 kg)   SpO2 96%   BMI 24.94 kg/m  Gen: NAD, resting comfortably CV: RRR no murmurs rubs or gallops Lungs: CTAB no crackles, wheeze, rhonchi Abdomen: soft/nontender/nondistended/normal bowel sounds. No rebound or guarding.  Ext: no edema Skin: warm, dry     Assessment and Plan   #social update- new puppy- a lot of work in 2025  #hypertension S: medication: amlodipine  5 mg- out for 10 days - he's down 5 lbs and would like to lose a few more- feels like lost some muscle mass as walking more and belt is tighter- change in distribution of weight BP Readings from Last 3 Encounters:   01/13/24 (!) 128/90  07/20/23 125/85  11/04/22 128/80  A/P: blood pressure mildly elevated today off medicine- we will refill today- id love for him to message me with home blood pressure in a week or so   #hyperlipidemia with reassuirng CT calcium at zero 07/2021 S: Medication: None Lab Results  Component Value Date   CHOL 167 07/06/2021   HDL 52.20 07/06/2021   LDLCALC 87 01/11/2020   LDLDIRECT 90.0 07/06/2021   TRIG 261.0 (H) 07/06/2021   CHOLHDL 3 07/06/2021   A/P: Lipids very mildly elevated on last check year-had an executive physical last year with LDL up over 110-discussed healthy eating/really exercise (already doing and weight loss to try to get this back down)  #Vitamin D deficiency S: Medication: multivitamin  - also took some D after low last year of 17 with [facility] A/P: Has upcoming labs with [facility] executive physical-if vitamin D remains low we may need to add a specific vitamin D level  # Hyperglycemia/insulin resistance/prediabetes- a1c at 5.7  S:  Medication: None  A/P: Discussed prediabetes diagnosis with elevated A1c  with [facility] physical-recommended working on improving diet, continue exercise, mild weight loss or weight redistribution    Recommended follow up: Return in about 6 months (around 07/15/2024) for physical or sooner if needed.Schedule b4 you leave. Future Appointments  Date Time Provider Department  Center  07/20/2024 10:00 AM Reiley Bertagnolli, Saverio Curling, MD [facility]    Lab/Order associations:   ICD-10-CM   1. Essential hypertension  I10     2. Mild hyperlipidemia  E78.5     3. Vitamin D deficiency  E55.9     4. Prediabetes  R73.03       Meds ordered this encounter  Medications   amLODipine  (NORVASC ) 5 MG tablet    Sig: Take 1 tablet (5 mg total) by mouth daily.    Dispense:  90 tablet    Refill:  3    Return precautions advised.  Clarisa Crooked, MD

## 2024-01-13 NOTE — Patient Instructions (Addendum)
 blood pressure mildly elevated today off medicine- we will refill today- id love for him to message me with home blood pressure in a week or so   Prediabetes noted with a1c at 5.7- lets keep up the regular exercise and try to improve dietary habits and maybe add some strength training back in   Recommended follow up: Return in about 6 months (around 07/15/2024) for physical or sooner if needed.Schedule b4 you leave.

## 2024-02-28 ENCOUNTER — Other Ambulatory Visit: Payer: Self-pay

## 2024-02-28 ENCOUNTER — Encounter: Payer: Self-pay | Admitting: Family Medicine

## 2024-02-28 DIAGNOSIS — D649 Anemia, unspecified: Secondary | ICD-10-CM

## 2024-03-22 ENCOUNTER — Other Ambulatory Visit (INDEPENDENT_AMBULATORY_CARE_PROVIDER_SITE_OTHER)

## 2024-03-22 ENCOUNTER — Ambulatory Visit: Payer: Self-pay | Admitting: Family Medicine

## 2024-03-22 DIAGNOSIS — D649 Anemia, unspecified: Secondary | ICD-10-CM

## 2024-03-22 LAB — CBC WITH DIFFERENTIAL/PLATELET
Basophils Absolute: 0 K/uL (ref 0.0–0.1)
Basophils Relative: 0.7 % (ref 0.0–3.0)
Eosinophils Absolute: 0.1 K/uL (ref 0.0–0.7)
Eosinophils Relative: 1.6 % (ref 0.0–5.0)
HCT: 38.5 % — ABNORMAL LOW (ref 39.0–52.0)
Hemoglobin: 12.7 g/dL — ABNORMAL LOW (ref 13.0–17.0)
Lymphocytes Relative: 31.7 % (ref 12.0–46.0)
Lymphs Abs: 1.6 K/uL (ref 0.7–4.0)
MCHC: 32.9 g/dL (ref 30.0–36.0)
MCV: 91.6 fl (ref 78.0–100.0)
Monocytes Absolute: 0.5 K/uL (ref 0.1–1.0)
Monocytes Relative: 9.7 % (ref 3.0–12.0)
Neutro Abs: 2.9 K/uL (ref 1.4–7.7)
Neutrophils Relative %: 56.3 % (ref 43.0–77.0)
Platelets: 229 K/uL (ref 150.0–400.0)
RBC: 4.21 Mil/uL — ABNORMAL LOW (ref 4.22–5.81)
RDW: 13.7 % (ref 11.5–15.5)
WBC: 5.1 K/uL (ref 4.0–10.5)

## 2024-03-27 ENCOUNTER — Other Ambulatory Visit: Payer: Self-pay | Admitting: *Deleted

## 2024-03-27 DIAGNOSIS — D649 Anemia, unspecified: Secondary | ICD-10-CM

## 2024-04-05 ENCOUNTER — Other Ambulatory Visit (INDEPENDENT_AMBULATORY_CARE_PROVIDER_SITE_OTHER)

## 2024-04-05 ENCOUNTER — Ambulatory Visit: Payer: Self-pay | Admitting: Family Medicine

## 2024-04-05 DIAGNOSIS — D649 Anemia, unspecified: Secondary | ICD-10-CM | POA: Diagnosis not present

## 2024-04-05 LAB — IBC + FERRITIN
Ferritin: 54.7 ng/mL (ref 22.0–322.0)
Iron: 104 ug/dL (ref 42–165)
Saturation Ratios: 32.2 % (ref 20.0–50.0)
TIBC: 323.4 ug/dL (ref 250.0–450.0)
Transferrin: 231 mg/dL (ref 212.0–360.0)

## 2024-04-11 ENCOUNTER — Other Ambulatory Visit: Payer: Self-pay | Admitting: Family Medicine

## 2024-04-11 DIAGNOSIS — D649 Anemia, unspecified: Secondary | ICD-10-CM

## 2024-04-11 NOTE — Telephone Encounter (Signed)
CBC orders placed 

## 2024-07-20 ENCOUNTER — Ambulatory Visit (INDEPENDENT_AMBULATORY_CARE_PROVIDER_SITE_OTHER): Admitting: Family Medicine

## 2024-07-20 ENCOUNTER — Encounter: Payer: Self-pay | Admitting: Family Medicine

## 2024-07-20 ENCOUNTER — Ambulatory Visit: Payer: Self-pay | Admitting: Family Medicine

## 2024-07-20 VITALS — BP 122/72 | HR 59 | Temp 98.0°F | Ht 71.0 in | Wt 180.8 lb

## 2024-07-20 DIAGNOSIS — Z0000 Encounter for general adult medical examination without abnormal findings: Secondary | ICD-10-CM | POA: Diagnosis not present

## 2024-07-20 DIAGNOSIS — Z131 Encounter for screening for diabetes mellitus: Secondary | ICD-10-CM

## 2024-07-20 DIAGNOSIS — I10 Essential (primary) hypertension: Secondary | ICD-10-CM | POA: Diagnosis not present

## 2024-07-20 DIAGNOSIS — R7303 Prediabetes: Secondary | ICD-10-CM | POA: Diagnosis not present

## 2024-07-20 DIAGNOSIS — E559 Vitamin D deficiency, unspecified: Secondary | ICD-10-CM

## 2024-07-20 DIAGNOSIS — E785 Hyperlipidemia, unspecified: Secondary | ICD-10-CM

## 2024-07-20 DIAGNOSIS — Z125 Encounter for screening for malignant neoplasm of prostate: Secondary | ICD-10-CM

## 2024-07-20 LAB — CBC WITH DIFFERENTIAL/PLATELET
Basophils Absolute: 0 K/uL (ref 0.0–0.1)
Basophils Relative: 0.9 % (ref 0.0–3.0)
Eosinophils Absolute: 0.1 K/uL (ref 0.0–0.7)
Eosinophils Relative: 1.3 % (ref 0.0–5.0)
HCT: 39.2 % (ref 39.0–52.0)
Hemoglobin: 13 g/dL (ref 13.0–17.0)
Lymphocytes Relative: 37.7 % (ref 12.0–46.0)
Lymphs Abs: 1.6 K/uL (ref 0.7–4.0)
MCHC: 33.2 g/dL (ref 30.0–36.0)
MCV: 91.6 fl (ref 78.0–100.0)
Monocytes Absolute: 0.4 K/uL (ref 0.1–1.0)
Monocytes Relative: 10.2 % (ref 3.0–12.0)
Neutro Abs: 2.1 K/uL (ref 1.4–7.7)
Neutrophils Relative %: 49.9 % (ref 43.0–77.0)
Platelets: 229 K/uL (ref 150.0–400.0)
RBC: 4.28 Mil/uL (ref 4.22–5.81)
RDW: 13.6 % (ref 11.5–15.5)
WBC: 4.2 K/uL (ref 4.0–10.5)

## 2024-07-20 LAB — HEMOGLOBIN A1C: Hgb A1c MFr Bld: 5.9 % (ref 4.6–6.5)

## 2024-07-20 LAB — VITAMIN D 25 HYDROXY (VIT D DEFICIENCY, FRACTURES): VITD: 24.96 ng/mL — ABNORMAL LOW (ref 30.00–100.00)

## 2024-07-20 MED ORDER — VITAMIN D (ERGOCALCIFEROL) 1.25 MG (50000 UNIT) PO CAPS: 50000.0000 [IU] | ORAL_CAPSULE | ORAL | 1 refills | Status: AC

## 2024-07-20 NOTE — Patient Instructions (Addendum)
 Please stop by lab before you go If you have mychart- we will send your results within 3 business days of us  receiving them.  If you do not have mychart- we will call you about results within 5 business days of us  receiving them.  *please also note that you will see labs on mychart as soon as they post. I will later go in and write notes on them- will say notes from Dr. Katrinka   Stool cards pick up from lab and submit those   Recommended follow up: Return in about 6 months (around 01/17/2025) for followup or sooner if needed.Schedule b4 you leave. Or at least yearly for physical

## 2024-07-20 NOTE — Progress Notes (Signed)
 Phone: [phone]   Subjective:  Patient presents today for their annual physical. Chief complaint-noted.   See problem oriented charting- ROS- full  review of systems was completed and negative  except for topics noted under acute/chronic concerns  The following were reviewed and entered/updated in epic: Past Medical History:  Diagnosis Date   Hamstring strain, initial encounter 09/06/2017    Chronic > 6 mos High on left HS   History of shingles    Hypertension    Polymyalgia rheumatica    on prednisone    Sneezing 09/30/2017   Patient Active Problem List   Diagnosis Date Noted   History of polymyalgia rheumatica 10/09/2018    Priority: High   Mild hyperlipidemia 01/13/2024    Priority: Medium    Vitamin D  deficiency 01/13/2024    Priority: Medium    Prediabetes 01/13/2024    Priority: Medium    Essential hypertension 09/30/2017    Priority: Medium    Herpes zoster ophthalmicus 04/24/2015    Priority: Medium    Erectile dysfunction 09/30/2017    Priority: Low   Morton's neuralgia, right 03/06/2020    Priority: 1.   Arthralgia 02/03/2018    Priority: 1.   Myalgia 02/05/2018   Past Surgical History:  Procedure Laterality Date   HAND SURGERY Right    2012-7 screws and a pin   ingrown toenail     as a child with sedation   WISDOM TOOTH EXTRACTION      Family History  Problem Relation Age of Onset   Heart disease Father        CABG age 71, died 75. Smoker, far different lifestyle.    Cancer Father        throat, smoker   Esophageal cancer Father    Hypertension Other        mother's side   Colon cancer Neg Hx    Colon polyps Neg Hx    Stomach cancer Neg Hx    Rectal cancer Neg Hx     Medications- reviewed and updated Current Outpatient Medications  Medication Sig Dispense Refill   amLODipine  (NORVASC ) 5 MG tablet Take 1 tablet (5 mg total) by mouth daily. 90 tablet 3   Ascorbic Acid (VITAMIN C) 1000 MG tablet Take 1,000 mg by mouth daily.      metroNIDAZOLE (METROCREAM) 0.75 % cream APPLY TO FACE FOR REDNESS EXTERNALLY ONCE A DAY 30 DAYS     Multiple Vitamin (MULTIVITAMIN PO) Take by mouth.     Turmeric (QC TUMERIC COMPLEX PO) Take by mouth.     No current facility-administered medications for this visit.    Allergies-reviewed and updated No Known Allergies  Social History   Social History Narrative   Married. 2 kids age 62 and 47  As of 02/03/2018      [employer]   Prior owner [employer]    Caffeine use: 3 cup in the morning , no soda      Hobbies: exercise, play golf, read   Objective  Objective:  BP 122/72 (BP Location: Left Arm, Patient Position: Sitting, Cuff Size: Normal)   Pulse (!) 59   Temp 98 F (36.7 C) (Temporal)   Ht 5' 11 (1.803 m)   Wt 180 lb 12.8 oz (82 kg)   SpO2 97%   BMI 25.22 kg/m  Gen: NAD, resting comfortably HEENT: Mucous membranes are moist. Oropharynx normal Neck: no thyromegaly CV: RRR no murmurs rubs or gallops Lungs: CTAB no crackles, wheeze, rhonchi Abdomen: soft/nontender/nondistended/normal  bowel sounds. No rebound or guarding.  Ext: no edema Skin: warm, dry Neuro: grossly normal, moves all extremities, PERRLA    Assessment and Plan  58 y.o. male presenting for annual physical.  Health Maintenance counseling: 1. Anticipatory guidance: Patient counseled regarding regular dental exams -q6 months, eye exams - yearly,  avoiding smoking and second hand smoke , limiting alcohol to 2 beverages per day - 4 per week max,  no illicit drugs .   2. Risk factor reduction:  Advised patient of need for regular exercise and diet rich and fruits and vegetables to reduce risk of heart attack and stroke.  Exercise- doing weights and Peloton and getting more vigorous.  Diet/weight management-weight loss in summer but has gained some muscle mass and regained some weights.  Wt Readings from Last 3 Encounters:  07/20/24 180 lb 12.8 oz (82 kg)  01/13/24 178 lb 12.8 oz (81.1 kg)   07/20/23 181 lb 6.4 oz (82.3 kg)  3. Immunizations/screenings/ancillary studies-Prevnar 20, Shingrix- consider in 2026, COVID declines- prior tough reaction.  Will be due for tetanus in the next 6 months or so Immunization History  Administered Date(s) Administered   Influenza Split 06/09/2012   Influenza,inj,Quad PF,6+ Mos 09/30/2017, 06/06/2018, 07/06/2021, 06/13/2022   Influenza-Unspecified 06/19/2024   Moderna Sars-Covid-2 Vaccination 10/31/2019, 11/28/2019, 08/08/2020   Td 08/30/2002   Tdap 01/02/2015  4. Prostate cancer screening- recent PSA with [facility] at 0.36 five months ago. Plus digital rectal exam with them Lab Results  Component Value Date   PSA 0.24 07/06/2021   PSA 0.37 01/11/2020   PSA 0.46 10/09/2018   5. Colon cancer screening -  tubular adenoma noted- reviewed reports form 05/31/2019 colonoscopy and plan is for 7 year repeat- but does have mild anemia and evaluating with stool cards today/soon 6. Skin cancer screening- dermatology yearly. advised regular sunscreen use. Denies worrisome, changing, or new skin lesions.  7. Smoking associated screening (lung cancer screening, AAA screen 65-75, UA)- never smoker 8. STD screening - only active with wife  Status of chronic or acute concerns   #social update- new puppy- a lot of work in 2025  #Asks about GLP-1 agonist for mildly overweight- we jointly opted out  # Mild anemia on [facility] labs and her repeat showed stability-had encouraged stool cards.  Iron stores have been adequate as of August - increased red meat and some fortified cereal  #hypertension S: medication: amlodipine  5 mg  BP Readings from Last 3 Encounters:  07/20/24 122/72  01/13/24 (!) 128/90  07/20/23 125/85  A/P: stable- continue current medicines    #hyperlipidemia with reassuirng CT calcium at zero 07/2021 S: Medication: None . LDL 113 most recently Lab Results  Component Value Date   CHOL 167 07/06/2021   HDL 52.20 07/06/2021   LDLCALC 87  01/11/2020   LDLDIRECT 90.0 07/06/2021   TRIG 261.0 (H) 07/06/2021   CHOLHDL 3 07/06/2021   A/P:  reassuring CT calcium scoring in past- likely repeat at least every 5 years- gets false positives on echocardiogram stress per his report.   #Vitamin D  deficiency S: Medication: multivitamin only in May 2025 but restarted vitamin D  specifically after prior low of 24- OTC (available over the counter without a prescription) [facility] dose A/P:  hopefully stable- update vitamin d  today. Continue current meds for now   # Hyperglycemia/insulin resistance/prediabetes- a1c at 5.7 at [facility] physical March 2024 and 5.9 in 2025 S:  Medication: None   A/P: hopefully improved- update a1c today. Continue without meds for  now    Recommended follow up: Return in about 6 months (around 01/17/2025) for followup or sooner if needed.Schedule b4 you leave.  Lab/Order associations:NOT fasting- [facility] labs in may   ICD-10-CM   1. Preventative health care  Z00.00     2. Essential hypertension  I10     3. Mild hyperlipidemia  E78.5     4. Prediabetes  R73.03     5. Screening for diabetes mellitus  Z13.1     6. Screening for prostate cancer  Z12.5     7. Vitamin D  deficiency  E55.9       No orders of the defined types were placed in this encounter.   Return precautions advised.  Garnette Lukes, MD

## 2025-08-02 ENCOUNTER — Encounter: Admitting: Family Medicine
